# Patient Record
Sex: Female | Born: 1970 | Race: White | Hispanic: No | Marital: Married | Smoking: Never smoker
Health system: Southern US, Community
[De-identification: ages and names within clinical notes are randomized; demographics above are authoritative.]

## PROBLEM LIST (undated history)

## (undated) DIAGNOSIS — M47812 Spondylosis without myelopathy or radiculopathy, cervical region: Secondary | ICD-10-CM

## (undated) DIAGNOSIS — M3500 Sicca syndrome, unspecified: Secondary | ICD-10-CM

## (undated) DIAGNOSIS — M1990 Unspecified osteoarthritis, unspecified site: Secondary | ICD-10-CM

## (undated) HISTORY — PX: BUNIONECTOMY: SHX129

## (undated) HISTORY — DX: Unspecified osteoarthritis, unspecified site: M19.90

## (undated) HISTORY — DX: Sjogren syndrome, unspecified: M35.00

## (undated) HISTORY — DX: Spondylosis without myelopathy or radiculopathy, cervical region: M47.812

---

## 1998-04-01 HISTORY — PX: BUNIONECTOMY: SHX129

## 2003-03-11 ENCOUNTER — Inpatient Hospital Stay (HOSPITAL_COMMUNITY): Admission: AD | Admit: 2003-03-11 | Discharge: 2003-03-13 | Payer: Self-pay | Admitting: Obstetrics & Gynecology

## 2003-04-21 ENCOUNTER — Other Ambulatory Visit: Admission: RE | Admit: 2003-04-21 | Discharge: 2003-04-21 | Payer: Self-pay | Admitting: Obstetrics and Gynecology

## 2004-06-19 ENCOUNTER — Other Ambulatory Visit: Admission: RE | Admit: 2004-06-19 | Discharge: 2004-06-19 | Payer: Self-pay | Admitting: Obstetrics and Gynecology

## 2005-10-31 ENCOUNTER — Inpatient Hospital Stay (HOSPITAL_COMMUNITY): Admission: AD | Admit: 2005-10-31 | Discharge: 2005-10-31 | Payer: Self-pay | Admitting: Obstetrics and Gynecology

## 2005-12-17 ENCOUNTER — Inpatient Hospital Stay (HOSPITAL_COMMUNITY): Admission: AD | Admit: 2005-12-17 | Discharge: 2005-12-17 | Payer: Self-pay | Admitting: Obstetrics and Gynecology

## 2006-01-08 ENCOUNTER — Inpatient Hospital Stay (HOSPITAL_COMMUNITY): Admission: AD | Admit: 2006-01-08 | Discharge: 2006-01-08 | Payer: Self-pay | Admitting: Obstetrics and Gynecology

## 2006-01-28 ENCOUNTER — Inpatient Hospital Stay (HOSPITAL_COMMUNITY): Admission: RE | Admit: 2006-01-28 | Discharge: 2006-01-30 | Payer: Self-pay | Admitting: Obstetrics and Gynecology

## 2010-07-01 ENCOUNTER — Inpatient Hospital Stay (HOSPITAL_COMMUNITY): Admission: AD | Admit: 2010-07-01 | Payer: Self-pay | Admitting: Family Medicine

## 2010-09-14 ENCOUNTER — Inpatient Hospital Stay (HOSPITAL_COMMUNITY)
Admission: RE | Admit: 2010-09-14 | Discharge: 2010-09-16 | DRG: 373 | Disposition: A | Discharge status: Home / Self Care | Payer: BC Managed Care – PPO | Source: Ambulatory Visit | Attending: Obstetrics and Gynecology | Admitting: Obstetrics and Gynecology

## 2010-09-14 DIAGNOSIS — O09529 Supervision of elderly multigravida, unspecified trimester: Principal | ICD-10-CM | POA: Diagnosis present

## 2010-09-14 LAB — CBC
HCT: 38.1 % (ref 36.0–46.0)
Hemoglobin: 12.7 g/dL (ref 12.0–15.0)
RBC: 4.29 MIL/uL (ref 3.87–5.11)
RDW: 14.3 % (ref 11.5–15.5)
WBC: 7 10*3/uL (ref 4.0–10.5)

## 2010-09-15 LAB — CBC
MCV: 90 fL (ref 78.0–100.0)
Platelets: 129 10*3/uL — ABNORMAL LOW (ref 150–400)
RBC: 3.99 MIL/uL (ref 3.87–5.11)
WBC: 13.6 10*3/uL — ABNORMAL HIGH (ref 4.0–10.5)

## 2010-09-16 LAB — RH IMMUNE GLOB WKUP(>/=20WKS)(NOT WOMEN'S HOSP)

## 2015-02-21 ENCOUNTER — Other Ambulatory Visit: Payer: Self-pay | Admitting: Obstetrics and Gynecology

## 2015-02-21 DIAGNOSIS — R928 Other abnormal and inconclusive findings on diagnostic imaging of breast: Secondary | ICD-10-CM

## 2015-03-01 ENCOUNTER — Ambulatory Visit
Admission: RE | Admit: 2015-03-01 | Discharge: 2015-03-01 | Disposition: A | Discharge status: Home / Self Care | Payer: BLUE CROSS/BLUE SHIELD | Source: Ambulatory Visit | Attending: Obstetrics and Gynecology | Admitting: Obstetrics and Gynecology

## 2015-03-01 DIAGNOSIS — R928 Other abnormal and inconclusive findings on diagnostic imaging of breast: Secondary | ICD-10-CM

## 2016-03-14 ENCOUNTER — Other Ambulatory Visit: Payer: Self-pay | Admitting: Rheumatology

## 2016-03-14 DIAGNOSIS — M19072 Primary osteoarthritis, left ankle and foot: Secondary | ICD-10-CM

## 2016-03-14 DIAGNOSIS — M47816 Spondylosis without myelopathy or radiculopathy, lumbar region: Secondary | ICD-10-CM | POA: Insufficient documentation

## 2016-03-14 DIAGNOSIS — M19042 Primary osteoarthritis, left hand: Secondary | ICD-10-CM

## 2016-03-14 DIAGNOSIS — M3500 Sicca syndrome, unspecified: Secondary | ICD-10-CM | POA: Insufficient documentation

## 2016-03-14 DIAGNOSIS — M19071 Primary osteoarthritis, right ankle and foot: Secondary | ICD-10-CM | POA: Insufficient documentation

## 2016-03-14 DIAGNOSIS — M19041 Primary osteoarthritis, right hand: Secondary | ICD-10-CM | POA: Insufficient documentation

## 2016-03-14 DIAGNOSIS — M47819 Spondylosis without myelopathy or radiculopathy, site unspecified: Secondary | ICD-10-CM | POA: Insufficient documentation

## 2016-03-14 MED ORDER — PILOCARPINE HCL 5 MG PO TABS: ORAL_TABLET | ORAL | 2 refills | Status: DC

## 2016-03-14 MED ORDER — DICLOFENAC SODIUM 1 % TD GEL: TRANSDERMAL | 3 refills | Status: DC

## 2016-03-14 NOTE — Telephone Encounter (Signed)
Last visit: 03/20/15 Next Visit: 03/18/16  Okay to refill Pilocarpine and Voltaren Gel?

## 2016-03-14 NOTE — Telephone Encounter (Signed)
Patient is requesting refills of pilocarpine and voltaren gel be sent to [facility] in [place]. Patient has scheduled appointment for 03/18/16.

## 2016-03-14 NOTE — Progress Notes (Signed)
Office Visit Note  Patient: Hailey Duncan             Date of Birth: 1970-07-03           MRN: [id number]             PCP: Eloise Levels, NP Referring: No ref. provider found Visit Date: 03/18/2016 Occupation: @GUAROCC @    Subjective:  No chief complaint on file. History of sicca symptoms, OA of hands and feet, DDD of the C-spine.  History of Present Illness: Hailey Duncan is a 45 y.o. female  Last seen 03/20/2015 and was requested to come back in one year.  Since last visit, patient is doing relatively well. She's having good relief with pilocarpine. She uses half pill twice a day. She is having pain in the right elbow joint secondary to tendinitis. The brace that we gave her on the last visit is helping her. She uses it from time to time. She states that she has a flare depending on how she holds the steering well while driving, pushing a shopping cart, and other activities with similar use of the right elbow joint. I've given her Voltaren gel which she finds very useful.  She also complains of pain to the medial scapular border on the left side. Patient is consistent with musculoskeletal pain. No shortness of breath no chest discomfort no radiation of pain or diaphoresis. No falls no injuries.   Activities of Daily Living:  Patient reports morning stiffness for 15 minutes.   Patient Denies nocturnal pain.  Difficulty dressing/grooming: Denies Difficulty climbing stairs: Denies Difficulty getting out of chair: Denies Difficulty using hands for taps, buttons, cutlery, and/or writing: Denies   Review of Systems  Constitutional: Negative for fatigue.  HENT: Negative for mouth sores and mouth dryness.   Eyes: Negative for dryness.  Respiratory: Negative for shortness of breath.   Gastrointestinal: Negative for constipation and diarrhea.  Musculoskeletal: Negative for myalgias and myalgias.  Skin: Negative for sensitivity to sunlight.  Psychiatric/Behavioral:  Negative for decreased concentration and sleep disturbance.    PMFS History:  Patient Active Problem List   Diagnosis Date Noted  . Sicca syndrome, unspecified ([place]) 03/14/2016  . Primary osteoarthritis of both feet 03/14/2016  . Primary osteoarthritis of both hands 03/14/2016  . Spondylosis of lumbar region without myelopathy or radiculopathy 03/14/2016    Past Medical History:  Diagnosis Date  . Cervical arthritis ([place])   . Osteoarthritis   . Sicca syndrome, unspecified ([place])     Family History  Problem Relation Age of Onset  . Macular degeneration Mother   . Seizures Mother   . Hypertension Mother   . Hypertension Father   . Hypercholesterolemia Father   . Tremor Father    Past Surgical History:  Procedure Laterality Date  . BUNIONECTOMY     Social History   Social History Narrative  . No narrative on file     Objective: Vital Signs: BP 116/78 (BP Location: Left Arm, Patient Position: Sitting, Cuff Size: Large)   Pulse 79   Resp 14   Ht 5\' 5"  (1.651 m)   Wt 112 lb (50.8 kg)   BMI 18.64 kg/m    Physical Exam  Constitutional: She is oriented to person, place, and time. She appears well-developed and well-nourished.  HENT:  Head: Normocephalic and atraumatic.  Eyes: EOM are normal. Pupils are equal, round, and reactive to light.  Cardiovascular: Normal rate, regular rhythm and normal heart sounds.  Exam reveals no  gallop and no friction rub.   No murmur heard. Pulmonary/Chest: Effort normal and breath sounds normal. She has no wheezes. She has no rales.  Abdominal: Soft. Bowel sounds are normal. She exhibits no distension. There is no tenderness. There is no guarding. No hernia.  Musculoskeletal: Normal range of motion. She exhibits no edema, tenderness or deformity.  Lymphadenopathy:    She has no cervical adenopathy.  Neurological: She is alert and oriented to person, place, and time. Coordination normal.  Skin: Skin is warm and dry. Capillary refill takes  less than 2 seconds. No rash noted.  Psychiatric: She has a normal mood and affect. Her behavior is normal.     Musculoskeletal Exam:  Full range of motion of all joints Grip strength is equal and strong bilaterally Fibromyalgia tender points are all absent  CDAI Exam: No CDAI exam completed.  No synovitis on examination  Investigation: Findings:  Labs from 08/2012:  CBC with diff showed a white cell count of 3.6.  Comprehensive metabolic panel was normal.  C3 was 87 which was slightly below normal.  ANA was 1:240 nuclear speckled pattern.  Her ACE level, sed rate, C4, UA, CCP, and ENA were normal.  Vitamin D was 40.    Imaging: No results found.  Speciality Comments: No specialty comments available.    Procedures:  No procedures performed Allergies: Clindamycin/lincomycin and Penicillins   Assessment / Plan:     Visit Diagnoses: Spondylosis of lumbar region without myelopathy or radiculopathy  Sicca syndrome, unspecified ([facility])  High risk medications (not anticoagulants) long-term use - good results w/ pilocarpine during daytime use.;   Primary osteoarthritis of both hands  Primary osteoarthritis of both feet   Plan: 1: Patient will use Voltaren gel to the medial scapular border on the left side. We discussed the quantity and frequency. #2: Patient will continue Voltaren gel for the right elbow joint when necessary. She will also use the brace. #3: She will continue pilocarpine. She knows the quantity/frequency. #3: I offered her XYLIMELT  patches once again today.  She forgot about this product but we'll order it and try to use it because she especially has dry mouth at night. She confesses that she's been relying on sips of water through the night because of the "cot[place]mouth" that she suffers from at night. She has seen her dentist who is very happy with her dental care. Patient has been having benefit from using the pilocarpine as it relates the halitosis as well as  overall sicca symptom relief.  She does not require any refills at this time.  Return to clinic in 1 year for follow-up and sooner if there is any problems.   Sicca symptoms Orders: No orders of the defined types were placed in this encounter.  No orders of the defined types were placed in this encounter.   Face-to-face time spent with patient was 30 minutes. 50% of time was spent in counseling and coordination of care.  Follow-Up Instructions: Return in about 1 year (around 03/18/2017) for sicca sxs, pilocarpine, oa hands/feet, right elbow pain; left scapular pain.   Eliezer Lofts, PA-C

## 2016-03-18 ENCOUNTER — Encounter: Payer: Self-pay | Admitting: Rheumatology

## 2016-03-18 ENCOUNTER — Ambulatory Visit (INDEPENDENT_AMBULATORY_CARE_PROVIDER_SITE_OTHER): Payer: BLUE CROSS/BLUE SHIELD | Admitting: Rheumatology

## 2016-03-18 VITALS — BP 116/78 | HR 79 | Resp 14 | Ht 65.0 in | Wt 112.0 lb

## 2016-03-18 DIAGNOSIS — M19072 Primary osteoarthritis, left ankle and foot: Secondary | ICD-10-CM | POA: Diagnosis not present

## 2016-03-18 DIAGNOSIS — M3500 Sicca syndrome, unspecified: Secondary | ICD-10-CM | POA: Diagnosis not present

## 2016-03-18 DIAGNOSIS — M19041 Primary osteoarthritis, right hand: Secondary | ICD-10-CM | POA: Diagnosis not present

## 2016-03-18 DIAGNOSIS — M19071 Primary osteoarthritis, right ankle and foot: Secondary | ICD-10-CM | POA: Diagnosis not present

## 2016-03-18 DIAGNOSIS — Z79899 Other long term (current) drug therapy: Secondary | ICD-10-CM

## 2016-03-18 DIAGNOSIS — M47816 Spondylosis without myelopathy or radiculopathy, lumbar region: Secondary | ICD-10-CM

## 2016-03-18 DIAGNOSIS — M19042 Primary osteoarthritis, left hand: Secondary | ICD-10-CM | POA: Diagnosis not present

## 2016-04-28 IMAGING — MG MM DIAG BREAST TOMO UNI RIGHT
4 series · 4 of 12 positions shown · non-contrast
Comparison: 02/16/2015 and earlier

CLINICAL DATA: Patient returns after screening study for evaluation
of possible right breast asymmetry.

EXAM:
DIGITAL DIAGNOSTIC RIGHT MAMMOGRAM WITH 3D TOMOSYNTHESIS WITH CAD
ULTRASOUND RIGHT BREAST

[R CC]
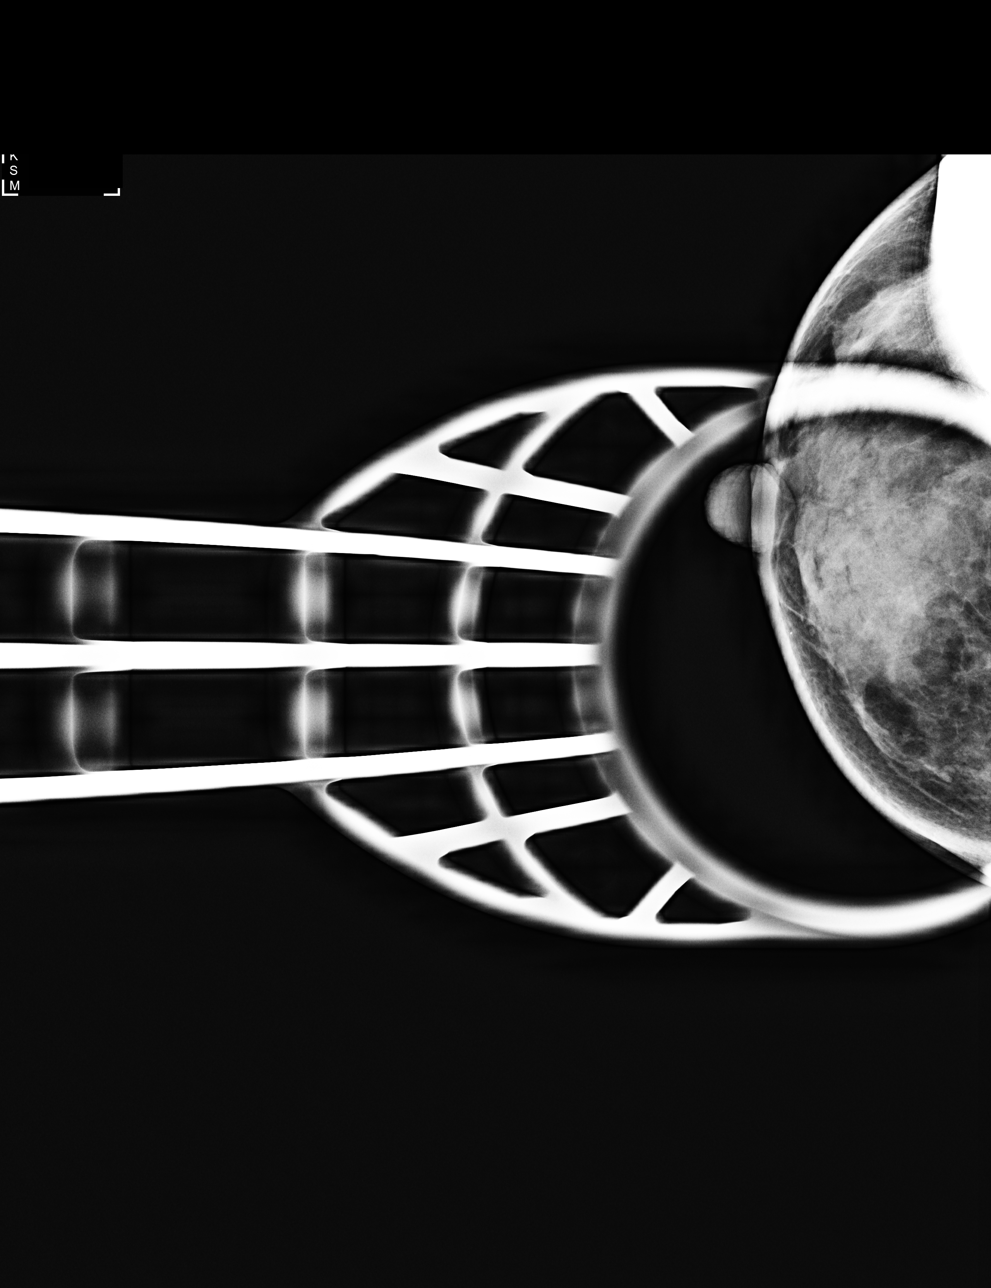

[R MLO]
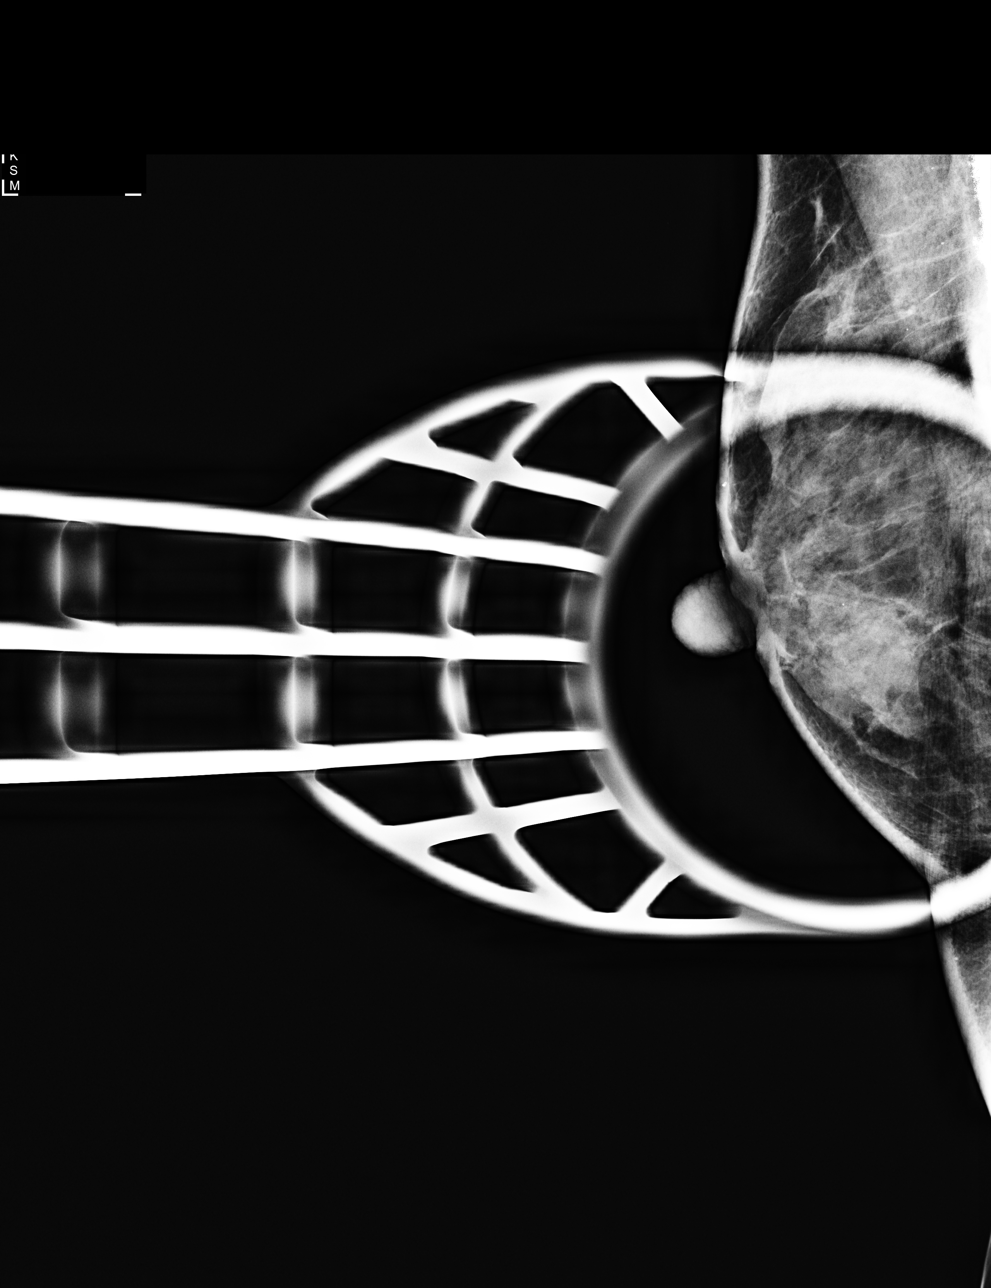

[R CC tomo · tomo slice 17/32.0]
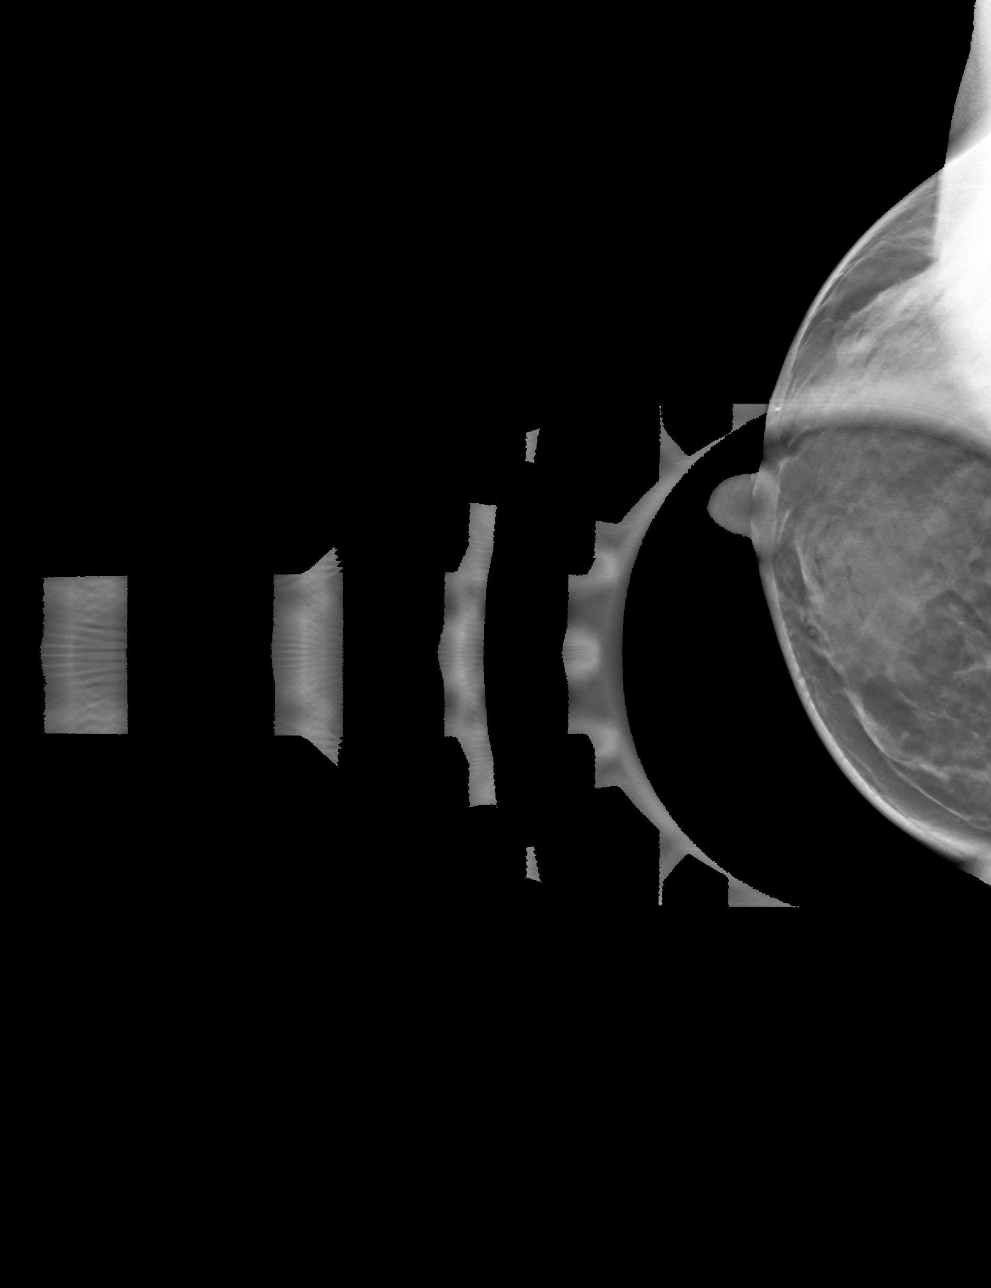

[R MLO tomo · tomo slice 19/37.0]
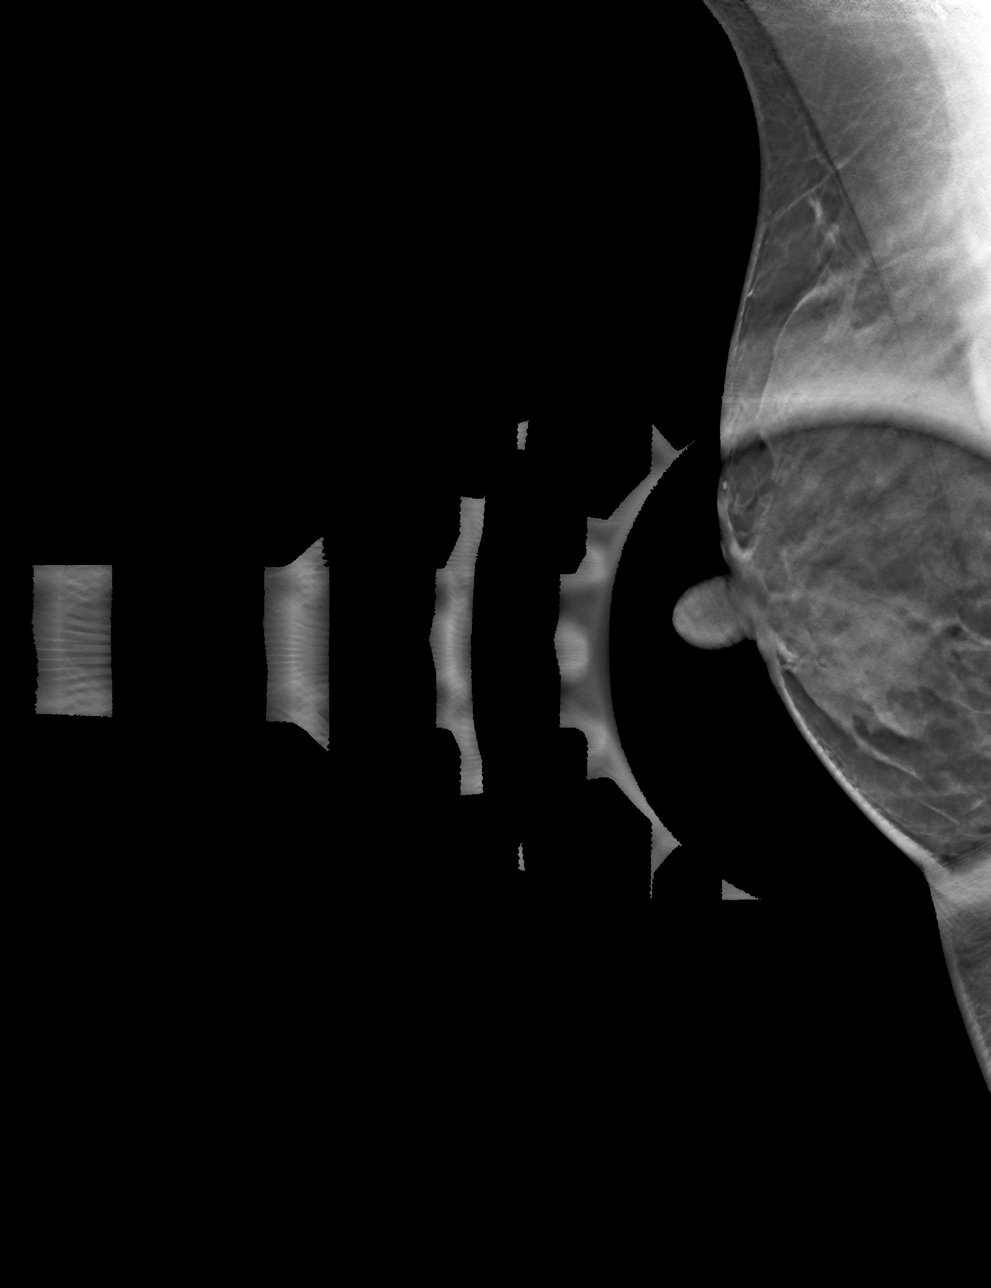

[4 of 12 positions shown; findings below may reference images not displayed]

ACR Breast Density Category d: The breast tissue is extremely dense,
which lowers the sensitivity of mammography.
FINDINGS: Additional views are performed, showing no persistent abnormality in
the lower inner quadrant of the right breast.

Mammographic images were processed with CAD.

On physical exam, I palpate no abnormality in the lower inner
quadrant of the right breast.

Targeted ultrasound is performed, showing normal appearing,
extremely dense tissue in the lower inner quadrant of the right
breast. No mass, distortion, or acoustic shadowing is demonstrated
with ultrasound.
IMPRESSION: No mammographic or ultrasound evidence for malignancy.

RECOMMENDATION:
Screening mammogram in one year.(Code:[id number])

I have discussed the findings and recommendations with the patient.
Results were also provided in writing at the conclusion of the
visit. If applicable, a reminder letter will be sent to the patient
regarding the next appointment.

BI-RADS CATEGORY  1: Negative.

## 2016-06-13 ENCOUNTER — Telehealth (INDEPENDENT_AMBULATORY_CARE_PROVIDER_SITE_OTHER): Payer: Self-pay | Admitting: Rheumatology

## 2016-06-13 NOTE — Telephone Encounter (Signed)
RECORDS FAXED TO [facility] [phone]

## 2016-06-17 ENCOUNTER — Other Ambulatory Visit: Payer: Self-pay | Admitting: Rheumatology

## 2016-06-17 NOTE — Telephone Encounter (Signed)
Last Visit: 03/18/16 Next Visit: 03/18/17  Okay to refill Pilocarpine?

## 2016-08-11 ENCOUNTER — Other Ambulatory Visit: Payer: Self-pay | Admitting: Rheumatology

## 2016-08-12 NOTE — Telephone Encounter (Signed)
ok 

## 2016-08-12 NOTE — Telephone Encounter (Signed)
Last Visit: 03/18/16 Next Visit: 03/18/17  Okay to refill Pilocarpine?

## 2016-09-27 ENCOUNTER — Other Ambulatory Visit: Payer: Self-pay | Admitting: Rheumatology

## 2016-09-27 NOTE — Telephone Encounter (Signed)
Last Visit: 03/18/16 Next Visit: 03/18/17  Okay to refill Pilocarpine per Dr. Estanislado Pandy

## 2016-10-21 ENCOUNTER — Other Ambulatory Visit: Payer: Self-pay | Admitting: Rheumatology

## 2016-10-21 NOTE — Telephone Encounter (Signed)
Last Visit: 03/18/16 Next Visit: 03/18/17  Okay to refill Pilocarpine per Dr. Estanislado Pandy

## 2016-11-18 ENCOUNTER — Other Ambulatory Visit: Payer: Self-pay | Admitting: Rheumatology

## 2016-11-18 NOTE — Telephone Encounter (Signed)
09/29/16 last visit  03/18/17 next visit  Ok to refill per Dr Deveshwar  

## 2016-12-20 ENCOUNTER — Other Ambulatory Visit: Payer: Self-pay | Admitting: Rheumatology

## 2016-12-20 NOTE — Telephone Encounter (Signed)
09/29/16 last visit  03/18/17 next visit  Ok to refill per Dr Estanislado Pandy

## 2017-01-22 ENCOUNTER — Other Ambulatory Visit: Payer: Self-pay | Admitting: Rheumatology

## 2017-01-22 NOTE — Telephone Encounter (Signed)
Last Visit: 03/18/16 Next Visit: 03/18/17  Okay to refill per Dr. Estanislado Pandy

## 2017-03-04 NOTE — Progress Notes (Signed)
Office Visit Note  Patient: Hailey Duncan             Date of Birth: 06/07/1970           MRN: [id number]             PCP: Patient, No Pcp Per Referring: Eloise Levels, NP Visit Date: 03/18/2017 Occupation: @GUAROCC @    Subjective:  Bilateral knee pain    History of Present Illness: Hailey Duncan is a 46 y.o. female with a history of sicca syndrome, positive ANA, and osteoarthritis.  Patient states she has been having increased bilateral knee pain, worse in her left knee.  She states her left knee will occasionally swell and "give out" on her.  She states she hears popping when going up and down stairs and experiences joint stiffness. She uses a pillow to prop up her knee at night.  Patient states her mouth dryness and eye dryness have improved significantly with the Pilocarpine.  She states she continues to drink water frequently and takes half a tablet of Pilocarpine in the morning and half at night.  She also continues to have right elbow tendonitis.  She wears her brace regularly and uses Voltaren gel, which helps.  She states she continues to have neck pain due to her disc disease and muscle tightness. She denies any muscle spasms of her back muscles.  She does neck exercises regularly.  She takes Tumeric 1000 mg in the morning and 500 mg in the evening.  She states her neck and elbow pain occasionally wake her up at night.     Activities of Daily Living:  Patient reports morning stiffness for 15  minutes.   Patient Reports nocturnal pain.  Difficulty dressing/grooming: Denies Difficulty climbing stairs: Denies Difficulty getting out of chair: Denies Difficulty using hands for taps, buttons, cutlery, and/or writing: Denies   Review of Systems  Constitutional: Positive for fatigue. Negative for weakness.  HENT: Negative for mouth sores, mouth dryness and nose dryness.   Eyes: Negative for redness and dryness.  Respiratory: Negative for cough, shortness of breath and  difficulty breathing.   Cardiovascular: Negative for chest pain, palpitations, hypertension and swelling in legs/feet.  Gastrointestinal: Negative for blood in stool, constipation and diarrhea.  Endocrine: Negative for increased urination.  Genitourinary: Negative for painful urination.  Musculoskeletal: Positive for arthralgias, joint pain and morning stiffness. Negative for joint swelling, myalgias, muscle weakness, muscle tenderness and myalgias.  Skin: Negative for color change, pallor, rash, hair loss, nodules/bumps, redness, skin tightness, ulcers and sensitivity to sunlight.  Allergic/Immunologic: Negative for susceptible to infections.  Neurological: Negative for dizziness, numbness and headaches.  Hematological: Negative for swollen glands.  Psychiatric/Behavioral: Positive for sleep disturbance. Negative for depressed mood. The patient is not nervous/anxious.     PMFS History:  Patient Active Problem List   Diagnosis Date Noted  . Sicca syndrome, unspecified ([place]) 03/14/2016  . Primary osteoarthritis of both feet 03/14/2016  . Primary osteoarthritis of both hands 03/14/2016  . Spondylosis of lumbar region without myelopathy or radiculopathy 03/14/2016    Past Medical History:  Diagnosis Date  . Cervical arthritis   . Osteoarthritis   . Sicca syndrome, unspecified ([place])     Family History  Problem Relation Age of Onset  . Macular degeneration Mother   . Seizures Mother        under control   . Hypertension Mother   . Hypertension Father   . Hypercholesterolemia Father   . Tremor Father   .  Hypercholesterolemia Sister    Past Surgical History:  Procedure Laterality Date  . BUNIONECTOMY     Social History   Social History Narrative  . Not on file     Objective: Vital Signs: BP 122/79 (BP Location: Left Arm, Patient Position: Sitting, Cuff Size: Normal)   Pulse 86   Resp 16   Ht 5\' 5"  (1.651 m)   Wt 111 lb (50.3 kg)   BMI 18.47 kg/m    Physical Exam    Constitutional: She is oriented to person, place, and time. She appears well-developed and well-nourished.  HENT:  Head: Normocephalic and atraumatic.  Eyes: Conjunctivae and EOM are normal.  Neck: Normal range of motion.  Cardiovascular: Normal rate, regular rhythm, normal heart sounds and intact distal pulses.  Pulmonary/Chest: Effort normal and breath sounds normal.  Abdominal: Soft. Bowel sounds are normal.  Lymphadenopathy:    She has no cervical adenopathy.  Neurological: She is alert and oriented to person, place, and time.  Skin: Skin is warm and dry. Capillary refill takes less than 2 seconds.  Psychiatric: She has a normal mood and affect. Her behavior is normal.  Nursing note and vitals reviewed.    Musculoskeletal Exam: C-spine, thoracic, and lumbar spine good ROM. Shoulder joints, elbow joints, and wrist joints good ROM.  MCPs, PIPs, and DIPs good ROM with no synovitis.  Hip joints, knee joint, and ankle joints good ROM.  MTPs,  PIP, and DIPs good ROM with no synovitis.  No trochanteric bursitis.    CDAI Exam: No CDAI exam completed.    Investigation: No additional findings.   Imaging: Xr Knee 3 View Left  Result Date: 03/18/2017 Mild medial compartment narrowing.  No chondrocalcinosis.  No osteophytes or erosions noted. Mild patellofemoral joint space narrowing. Impression: Mild osteoarthritis and mild chondromalacia patella.   Xr Knee 3 View Right  Result Date: 03/18/2017 Mild medial compartment narrowing.  No chondrocalcinosis.  No osteophytes or erosions noted. Impression: Mild osteoarthritis and mild patellofemoral joint space narrowing.    Speciality Comments: No specialty comments available.    Procedures:  No procedures performed Allergies: Clindamycin/lincomycin and Penicillins   Assessment / Plan:     Visit Diagnoses: Sicca syndrome, unspecified ([place]): She continues to take Pilocarpine 5 mg, 1/2 tablet in the morning and 1/2 tablet in the  evening, which has helped her dry mouth and dry eye significantly.  She drinks water frequently as well.  She will continue on Pilocarpine.    Lateral epicondylitis, right elbow: patient continues to have tenderness over right lateral epicondyle.  She continues to use her brace regularly and uses Voltaren gel.  Patient was given a handout for exercises.  ANA positive  Primary osteoarthritis of both hands: Discussed joint protection and muscle strengthening.  She uses Voltaren gel and takes tumeric daily.    Primary osteoarthritis of both feet: Discussed proper fitting shoes and use of metatarsal pads to help with her discomfort.  No synovitis on exam.    Spondylosis of lumbar region without myelopathy or radiculopathy: Chronic, stable.   DDD (degenerative disc disease), cervical: Chronic pain.  Good ROM.  She performs neck exercises at home.  She previously went to physical therapy, which helped a lot.  She uses Voltaren gel for pain relief.  Handout for neck exercises was provided.   History of TMJ disorder  History of bunionectomy: Stiffness, no discomfort at this time.  Discussed the importance of proper fitting shoes.   Left knee pain, unspecified chronicity - Good  ROM.  No effusion or warmth on exam.  Discussed cortisone injection of her knee in the future if her symptoms progress. X-rays reveal mild osteoarthritis and mild chondromalacia patella. Handout of knee exercises was provided. Plan: XR KNEE 3 VIEW LEFT  Right knee pain, unspecified chronicity -Good ROM with no effusion or warmth on exam.  Discussed cortisone injection of her knee in the future if symptoms progress. Hailey Duncan reveals mild osteoarthritis. Plan: XR KNEE 3 VIEW RIGHT    Orders: Orders Placed This Encounter  Procedures  . XR KNEE 3 VIEW LEFT  . XR KNEE 3 VIEW RIGHT   No orders of the defined types were placed in this encounter.   Face-to-face time spent with patient was 30 minutes. Greater than 50% of time was  spent in counseling and coordination of care.  Follow-Up Instructions: Return in about 1 year (around 03/18/2018) for Sicca Syndrome and osteoarthritis .  Note - This record has been created using [employer].  Chart creation errors have been sought, but may not always  have been located. Such creation errors do not reflect on  the standard of medical care.

## 2017-03-18 ENCOUNTER — Encounter: Payer: Self-pay | Admitting: Rheumatology

## 2017-03-18 ENCOUNTER — Ambulatory Visit (INDEPENDENT_AMBULATORY_CARE_PROVIDER_SITE_OTHER): Payer: Self-pay

## 2017-03-18 ENCOUNTER — Ambulatory Visit: Payer: BLUE CROSS/BLUE SHIELD | Admitting: Rheumatology

## 2017-03-18 VITALS — BP 122/79 | HR 86 | Resp 16 | Ht 65.0 in | Wt 111.0 lb

## 2017-03-18 DIAGNOSIS — M3500 Sicca syndrome, unspecified: Secondary | ICD-10-CM

## 2017-03-18 DIAGNOSIS — Z8739 Personal history of other diseases of the musculoskeletal system and connective tissue: Secondary | ICD-10-CM | POA: Diagnosis not present

## 2017-03-18 DIAGNOSIS — M19042 Primary osteoarthritis, left hand: Secondary | ICD-10-CM

## 2017-03-18 DIAGNOSIS — M5030 Other cervical disc degeneration, unspecified cervical region: Secondary | ICD-10-CM | POA: Diagnosis not present

## 2017-03-18 DIAGNOSIS — R768 Other specified abnormal immunological findings in serum: Secondary | ICD-10-CM

## 2017-03-18 DIAGNOSIS — M25562 Pain in left knee: Secondary | ICD-10-CM | POA: Diagnosis not present

## 2017-03-18 DIAGNOSIS — M19071 Primary osteoarthritis, right ankle and foot: Secondary | ICD-10-CM | POA: Diagnosis not present

## 2017-03-18 DIAGNOSIS — M19041 Primary osteoarthritis, right hand: Secondary | ICD-10-CM | POA: Diagnosis not present

## 2017-03-18 DIAGNOSIS — M47816 Spondylosis without myelopathy or radiculopathy, lumbar region: Secondary | ICD-10-CM

## 2017-03-18 DIAGNOSIS — M25561 Pain in right knee: Secondary | ICD-10-CM | POA: Diagnosis not present

## 2017-03-18 DIAGNOSIS — Z9889 Other specified postprocedural states: Secondary | ICD-10-CM | POA: Diagnosis not present

## 2017-03-18 DIAGNOSIS — M7711 Lateral epicondylitis, right elbow: Secondary | ICD-10-CM

## 2017-03-18 DIAGNOSIS — M19072 Primary osteoarthritis, left ankle and foot: Secondary | ICD-10-CM

## 2017-03-18 NOTE — Patient Instructions (Signed)
Knee Exercises Ask your health care provider which exercises are safe for you. Do exercises exactly as told by your health care provider and adjust them as directed. It is normal to feel mild stretching, pulling, tightness, or discomfort as you do these exercises, but you should stop right away if you feel sudden pain or your pain gets worse.Do not begin these exercises until told by your health care provider. STRETCHING AND RANGE OF MOTION EXERCISES These exercises warm up your muscles and joints and improve the movement and flexibility of your knee. These exercises also help to relieve pain, numbness, and tingling. Exercise A: Knee Extension, Prone 1. Lie on your abdomen on a bed. 2. Place your left / right knee just beyond the edge of the surface so your knee is not on the bed. You can put a towel under your left / right thigh just above your knee for comfort. 3. Relax your leg muscles and allow gravity to straighten your knee. You should feel a stretch behind your left / right knee. 4. Hold this position for __________ seconds. 5. Scoot up so your knee is supported between repetitions. Repeat __________ times. Complete this stretch __________ times a day. Exercise B: Knee Flexion, Active  1. Lie on your back with both knees straight. If this causes back discomfort, bend your left / right knee so your foot is flat on the floor. 2. Slowly slide your left / right heel back toward your buttocks until you feel a gentle stretch in the front of your knee or thigh. 3. Hold this position for __________ seconds. 4. Slowly slide your left / right heel back to the starting position. Repeat __________ times. Complete this exercise __________ times a day. Exercise C: Quadriceps, Prone  1. Lie on your abdomen on a firm surface, such as a bed or padded floor. 2. Bend your left / right knee and hold your ankle. If you cannot reach your ankle or pant leg, loop a belt around your foot and grab the belt  instead. 3. Gently pull your heel toward your buttocks. Your knee should not slide out to the side. You should feel a stretch in the front of your thigh and knee. 4. Hold this position for __________ seconds. Repeat __________ times. Complete this stretch __________ times a day. Exercise D: Hamstring, Supine 1. Lie on your back. 2. Loop a belt or towel over the ball of your left / right foot. The ball of your foot is on the walking surface, right under your toes. 3. Straighten your left / right knee and slowly pull on the belt to raise your leg until you feel a gentle stretch behind your knee. ? Do not let your left / right knee bend while you do this. ? Keep your other leg flat on the floor. 4. Hold this position for __________ seconds. Repeat __________ times. Complete this stretch __________ times a day. STRENGTHENING EXERCISES These exercises build strength and endurance in your knee. Endurance is the ability to use your muscles for a long time, even after they get tired. Exercise E: Quadriceps, Isometric  1. Lie on your back with your left / right leg extended and your other knee bent. Put a rolled towel or small pillow under your knee if told by your health care provider. 2. Slowly tense the muscles in the front of your left / right thigh. You should see your kneecap slide up toward your hip or see increased dimpling just above the knee. This   motion will push the back of the knee toward the floor. 3. For __________ seconds, keep the muscle as tight as you can without increasing your pain. 4. Relax the muscles slowly and completely. Repeat __________ times. Complete this exercise __________ times a day. Exercise F: Straight Leg Raises - Quadriceps 1. Lie on your back with your left / right leg extended and your other knee bent. 2. Tense the muscles in the front of your left / right thigh. You should see your kneecap slide up or see increased dimpling just above the knee. Your thigh may  even shake a bit. 3. Keep these muscles tight as you raise your leg 4-6 inches (10-15 cm) off the floor. Do not let your knee bend. 4. Hold this position for __________ seconds. 5. Keep these muscles tense as you lower your leg. 6. Relax your muscles slowly and completely after each repetition. Repeat __________ times. Complete this exercise __________ times a day. Exercise G: Hamstring, Isometric 1. Lie on your back on a firm surface. 2. Bend your left / right knee approximately __________ degrees. 3. Dig your left / right heel into the surface as if you are trying to pull it toward your buttocks. Tighten the muscles in the back of your thighs to dig as hard as you can without increasing any pain. 4. Hold this position for __________ seconds. 5. Release the tension gradually and allow your muscles to relax completely for __________ seconds after each repetition. Repeat __________ times. Complete this exercise __________ times a day. Exercise H: Hamstring Curls  If told by your health care provider, do this exercise while wearing ankle weights. Begin with __________ weights. Then increase the weight by 1 lb (0.5 kg) increments. Do not wear ankle weights that are more than __________. 1. Lie on your abdomen with your legs straight. 2. Bend your left / right knee as far as you can without feeling pain. Keep your hips flat against the floor. 3. Hold this position for __________ seconds. 4. Slowly lower your leg to the starting position.  Repeat __________ times. Complete this exercise __________ times a day. Exercise I: Squats (Quadriceps) 1. Stand in front of a table, with your feet and knees pointing straight ahead. You may rest your hands on the table for balance but not for support. 2. Slowly bend your knees and lower your hips like you are going to sit in a chair. ? Keep your weight over your heels, not over your toes. ? Keep your lower legs upright so they are parallel with the table  legs. ? Do not let your hips go lower than your knees. ? Do not bend lower than told by your health care provider. ? If your knee pain increases, do not bend as low. 3. Hold the squat position for __________ seconds. 4. Slowly push with your legs to return to standing. Do not use your hands to pull yourself to standing. Repeat __________ times. Complete this exercise __________ times a day. Exercise J: Wall Slides (Quadriceps)  1. Lean your back against a smooth wall or door while you walk your feet out 18-24 inches (46-61 cm) from it. 2. Place your feet hip-width apart. 3. Slowly slide down the wall or door until your knees bend __________ degrees. Keep your knees over your heels, not over your toes. Keep your knees in line with your hips. 4. Hold for __________ seconds. Repeat __________ times. Complete this exercise __________ times a day. Exercise K: Straight Leg Raises -   Hip Abductors 1. Lie on your side with your left / right leg in the top position. Lie so your head, shoulder, knee, and hip line up. You may bend your bottom knee to help you keep your balance. 2. Roll your hips slightly forward so your hips are stacked directly over each other and your left / right knee is facing forward. 3. Leading with your heel, lift your top leg 4-6 inches (10-15 cm). You should feel the muscles in your outer hip lifting. ? Do not let your foot drift forward. ? Do not let your knee roll toward the ceiling. 4. Hold this position for __________ seconds. 5. Slowly return your leg to the starting position. 6. Let your muscles relax completely after each repetition. Repeat __________ times. Complete this exercise __________ times a day. Exercise L: Straight Leg Raises - Hip Extensors 1. Lie on your abdomen on a firm surface. You can put a pillow under your hips if that is more comfortable. 2. Tense the muscles in your buttocks and lift your left / right leg about 4-6 inches (10-15 cm). Keep your knee  straight as you lift your leg. 3. Hold this position for __________ seconds. 4. Slowly lower your leg to the starting position. 5. Let your leg relax completely after each repetition. Repeat __________ times. Complete this exercise __________ times a day. This information is not intended to replace advice given to you by your health care provider. Make sure you discuss any questions you have with your health care provider. Document Released: 01/30/2005 Document Revised: 12/11/2015 Document Reviewed: 01/22/2015 Elsevier Interactive Patient Education  2018 [place]. Neck Exercises Neck exercises can be important for many reasons:  They can help you to improve and maintain flexibility in your neck. This can be especially important as you age.  They can help to make your neck stronger. This can make movement easier.  They can reduce or prevent neck pain.  They may help your upper back.  Ask your health care provider which neck exercises would be best for you. Exercises Neck Press Repeat this exercise 10 times. Do it first thing in the morning and right before bed or as told by your health care provider. 1. Lie on your back on a firm bed or on the floor with a pillow under your head. 2. Use your neck muscles to push your head down on the pillow and straighten your spine. 3. Hold the position as well as you can. Keep your head facing up and your chin tucked. 4. Slowly count to 5 while holding this position. 5. Relax for a few seconds. Then repeat.  Isometric Strengthening Do a full set of these exercises 2 times a day or as told by your health care provider. 1. Sit in a supportive chair and place your hand on your forehead. 2. Push forward with your head and neck while pushing back with your hand. Hold for 10 seconds. 3. Relax. Then repeat the exercise 3 times. 4. Next, do thesequence again, this time putting your hand against the back of your head. Use your head and neck to push  backward against the hand pressure. 5. Finally, do the same exercise on either side of your head, pushing sideways against the pressure of your hand.  Prone Head Lifts Repeat this exercise 5 times. Do this 2 times a day or as told by your health care provider. 1. Lie face-down, resting on your elbows so that your chest and upper back are raised.  2. Start with your head facing downward, near your chest. Position your chin either on or near your chest. 3. Slowly lift your head upward. Lift until you are looking straight ahead. Then continue lifting your head as far back as you can stretch. 4. Hold your head up for 5 seconds. Then slowly lower it to your starting position.  Supine Head Lifts Repeat this exercise 8-10 times. Do this 2 times a day or as told by your health care provider. 1. Lie on your back, bending your knees to point to the ceiling and keeping your feet flat on the floor. 2. Lift your head slowly off the floor, raising your chin toward your chest. 3. Hold for 5 seconds. 4. Relax and repeat.  Scapular Retraction Repeat this exercise 5 times. Do this 2 times a day or as told by your health care provider. 1. Stand with your arms at your sides. Look straight ahead. 2. Slowly pull both shoulders backward and downward until you feel a stretch between your shoulder blades in your upper back. 3. Hold for 10-30 seconds. 4. Relax and repeat.  Contact a health care provider if:  Your neck pain or discomfort gets much worse when you do an exercise.  Your neck pain or discomfort does not improve within 2 hours after you exercise. If you have any of these problems, stop exercising right away. Do not do the exercises again unless your health care provider says that you can. Get help right away if:  You develop sudden, severe neck pain. If this happens, stop exercising right away. Do not do the exercises again unless your health care provider says that you can. Exercises Neck  Stretch  Repeat this exercise 3-5 times. 1. Do this exercise while standing or while sitting in a chair. 2. Place your feet flat on the floor, shoulder-width apart. 3. Slowly turn your head to the right. Turn it all the way to the right so you can look over your right shoulder. Do not tilt or tip your head. 4. Hold this position for 10-30 seconds. 5. Slowly turn your head to the left, to look over your left shoulder. 6. Hold this position for 10-30 seconds.  Neck Retraction Repeat this exercise 8-10 times. Do this 3-4 times a day or as told by your health care provider. 1. Do this exercise while standing or while sitting in a sturdy chair. 2. Look straight ahead. Do not bend your neck. 3. Use your fingers to push your chin backward. Do not bend your neck for this movement. Continue to face straight ahead. If you are doing the exercise properly, you will feel a slight sensation in your throat and a stretch at the back of your neck. 4. Hold the stretch for 1-2 seconds. Relax and repeat.  This information is not intended to replace advice given to you by your health care provider. Make sure you discuss any questions you have with your health care provider. Document Released: 02/27/2015 Document Revised: 08/24/2015 Document Reviewed: 09/26/2014 Elsevier Interactive Patient Education  2017 [place] Ask your health care provider which exercises are safe for you. Do exercises exactly as told by your health care provider and adjust them as directed. It is normal to feel mild stretching, pulling, tightness, or discomfort as you do these exercises, but you should stop right away if you feel sudden pain or your pain gets worse. Do not begin these exercises until told by your health care provider. Stretching and  range of motion exercises These exercises warm up your muscles and joints and improve the movement and flexibility of your elbow. These exercises also help to relieve  pain, numbness, and tingling. Exercise A: Wrist extensor stretch 1. Extend your left / right elbow with your fingers pointing down. 2. Gently pull the palm of your left / right hand toward you until you feel a gentle stretch on the top of your forearm. 3. To increase the stretch, push your left / right hand toward the outer edge or pinkie side of your forearm. 4. Hold this position for __________ seconds. Repeat __________ times. Complete this exercise __________ times a day. If directed by your health care provider, repeat this stretch except do it with a bent elbow this time. Exercise B: Wrist flexor stretch  1. Extend your left / right elbow and turn your palm upward. 2. Gently pull your left / right palm and fingertips back so your wrist extends and your fingers point more toward the ground. 3. You should feel a gentle stretch on the inside of your forearm. 4. Hold this position for __________ seconds. Repeat __________ times. Complete this exercise __________ times a day. If directed by your health care provider, repeat this stretch except do it with a bent elbow this time. Strengthening exercises These exercises build strength and endurance in your elbow. Endurance is the ability to use your muscles for a long time, even after they get tired. Exercise C: Wrist extensors  1. Sit with your left / right forearm palm-down and fully supported on a table or countertop. Your elbow should be resting below the height of your shoulder. 2. Let your left / right wrist extend over the edge of the surface. 3. Loosely hold a __________ weight or a piece of rubber exercise band or tubing in your left / right hand. Slowly curl your left / right hand up toward your forearm. If you are using band or tubing, hold the band or tubing in place with your other hand to provide resistance. 4. Hold this position for __________ seconds. 5. Slowly return to the starting position. Repeat __________ times. Complete  this exercise __________ times a day. Exercise D: Radial deviators  1. Stand with a __________ weight in your left / righthand. Or, sit while holding a rubber exercise band or tubing with your other arm supported on a table or countertop. Position your hand so your thumb is on top. 2. Raise your hand upward in front of you so your thumb travels toward your forearm, or pull up on the rubber tubing. 3. Hold this position for __________ seconds. 4. Slowly return to the starting position. Repeat __________ times. Complete this exercise __________ times a day. Exercise E: Eccentric wrist extensors 1. Sit with your left / right forearm palm-down and fully supported on a table or countertop. Your elbow should be resting below the height of your shoulder. 2. If told by your health care provider, hold a __________ weight in your hand. 3. Let your left / right wrist extend over the edge of the surface. 4. Use your other hand to lift up your left / right hand toward your forearm. Keep your forearm on the table. 5. Using only the muscles in your left / right hand, slowly lower your hand back down to the starting position. Repeat __________ times. Complete this exercise __________ times a day. This information is not intended to replace advice given to you by your health care provider. Make sure you  discuss any questions you have with your health care provider. Document Released: 03/18/2005 Document Revised: 11/22/2015 Document Reviewed: 12/15/2014 Elsevier Interactive Patient Education  [employer].

## 2017-07-22 NOTE — Progress Notes (Signed)
Office Visit Note  Patient: Hailey Duncan             Date of Birth: Nov 01, 1970           MRN: [id number]             PCP: Molli Posey, MD Referring: No ref. provider found Visit Date: 07/24/2017 Occupation: @GUAROCC @    Subjective:  Left elbow pain   History of Present Illness: Hailey Duncan is a 47 y.o. female with history of sicca syndrome and osteoarthritis.  Patient states that since her last visit she continues to have significant discomfort of her left elbow.  She states that she has been using NSAIDs, Voltaren gel, and a tennis elbow brace without relief.  She states that she has been unable to do her daily activities.  She states that time she feels a clicking or popping sensation in her left elbow and  is also starting to have discomfort in her left shoulder due to making positional accommodations for her elbow.  She reports that she is unable to lift anything more than a couple pounds without significant discomfort.  The pain is been waking her up at night.  She states that she has not had a full night sleep in a couple months.   Activities of Daily Living:  Patient reports morning stiffness for 20 minutes.   Patient Reports nocturnal pain.  Difficulty dressing/grooming: Denies Difficulty climbing stairs: Denies Difficulty getting out of chair: Denies Difficulty using hands for taps, buttons, cutlery, and/or writing: Reports   Review of Systems  Constitutional: Negative for fatigue.  HENT: Positive for mouth dryness (takes Pilocarpine ). Negative for mouth sores and nose dryness.   Eyes: Positive for dryness. Negative for pain and visual disturbance.  Respiratory: Negative for cough, hemoptysis, shortness of breath and difficulty breathing.   Cardiovascular: Negative for chest pain, palpitations, hypertension and swelling in legs/feet.  Gastrointestinal: Negative for blood in stool, constipation and diarrhea.  Endocrine: Negative for increased urination.    Genitourinary: Negative for painful urination.  Musculoskeletal: Positive for arthralgias, joint pain and morning stiffness. Negative for joint swelling, myalgias, muscle weakness, muscle tenderness and myalgias.  Skin: Negative for color change, pallor, rash, hair loss, nodules/bumps, skin tightness, ulcers and sensitivity to sunlight.  Allergic/Immunologic: Negative for susceptible to infections.  Neurological: Negative for dizziness, numbness, headaches and weakness.  Hematological: Negative for swollen glands.  Psychiatric/Behavioral: Negative for depressed mood and sleep disturbance. The patient is nervous/anxious.     PMFS History:  Patient Active Problem List   Diagnosis Date Noted  . Sicca syndrome, unspecified ([place]) 03/14/2016  . Primary osteoarthritis of both feet 03/14/2016  . Primary osteoarthritis of both hands 03/14/2016  . Spondylosis of lumbar region without myelopathy or radiculopathy 03/14/2016    Past Medical History:  Diagnosis Date  . Cervical arthritis   . Osteoarthritis   . Sicca syndrome, unspecified ([place])     Family History  Problem Relation Age of Onset  . Macular degeneration Mother   . Seizures Mother        under control   . Hypertension Mother   . Hypertension Father   . Hypercholesterolemia Father   . Tremor Father   . Hypercholesterolemia Sister    Past Surgical History:  Procedure Laterality Date  . BUNIONECTOMY     Social History   Social History Narrative  . Not on file     Objective: Vital Signs: BP 128/67 (BP Location: Right Arm, Patient Position:  Sitting, Cuff Size: Normal)   Pulse 79   Resp 14   Ht 5\' 5"  (1.651 m)   Wt 116 lb (52.6 kg)   BMI 19.30 kg/m    Physical Exam  Constitutional: She is oriented to person, place, and time. She appears well-developed and well-nourished.  HENT:  Head: Normocephalic and atraumatic.  Eyes: Conjunctivae and EOM are normal.  Neck: Normal range of motion.  Cardiovascular: Normal rate,  regular rhythm, normal heart sounds and intact distal pulses.  Pulmonary/Chest: Effort normal and breath sounds normal.  Abdominal: Soft. Bowel sounds are normal.  Lymphadenopathy:    She has no cervical adenopathy.  Neurological: She is alert and oriented to person, place, and time.  Skin: Skin is warm and dry. Capillary refill takes less than 2 seconds.  Psychiatric: She has a normal mood and affect. Her behavior is normal.  Nursing note and vitals reviewed.    Musculoskeletal Exam: C-spine, thoracic spine, lumbar spine good ROM.  No midline spinal tenderness.  No SI joint tenderness. Crepitus of left shoulder with ROM.  Discomfort with ROM of left shoulder.  Good ROM of right shoulder. Tenderness of left lateral epicondyle. Right elbow good ROM with no discomfort.  Wrist joints, MCPs, PIPs, and DIPs good ROM with no synovitis.  PIP and DIP synovial thickening consistent with osteoarthritis.  Hip joints, knee joints, ankle joints, MTPs, PIPs, and DIPs good ROM with no synovitis.    CDAI Exam: No CDAI exam completed.    Investigation: No additional findings. CBC Latest Ref Rng & Units 09/15/2010 09/14/2010  WBC 4.0 - 10.5 K/uL 13.6(H) 7.0  Hemoglobin 12.0 - 15.0 g/dL 11.8(L) 12.7  Hematocrit 36.0 - 46.0 % 35.9(L) 38.1  Platelets 150 - 400 K/uL 129(L) 141(L)  No flowsheet data found.   Imaging: Xr Elbow 2 Views Left  Result Date: 07/24/2017 No joint space narrowing or erosive changes were noted. Impression: Unremarkable x-ray of the elbow joint.   Speciality Comments: No specialty comments available.    Procedures:  Medium Joint Inj: L lateral epicondyle on 07/24/2017 2:47 PM Indications: pain Details: 27 G 1.5 in needle, lateral approach Medications: 1 mL lidocaine 1 %; 30 mg triamcinolone acetonide 40 MG/ML Aspirate: 0 mL Outcome: tolerated well, no immediate complications Procedure, treatment alternatives, risks and benefits explained, specific risks discussed. Consent was  given by the patient. Immediately prior to procedure a time out was called to verify the correct patient, procedure, equipment, support staff and site/side marked as required. Patient was prepped and draped in the usual sterile fashion.     Allergies: Clindamycin/lincomycin and Penicillins   Assessment / Plan:     Visit Diagnoses: Sicca syndrome, unspecified ([place]): She continues to have sicca symptoms.  She takes pilocarpine on a daily basis.  She is taking 7.5 mg/day.  Lateral epicondylitis, left elbow - She continues to have tenderness of the left lateral epicondyle. Her pain has been preventing her from doing her normal activities of daily living.  She has been having pain wake her up at night as well.  Her left shoulder has been starting to cause discomfort due to her having to make positional accommodations. She has tried a brace, voltaren gel, NSAIDs, and stretching exercises.  She requested a cortisone injection today in the office.  A referral to [facility] was made today for management of lateral epicondylitis.  She requested PT at this facility.  She was advised to continue to ice and use her brace.  -  Plan: Ambulatory referral to Physical Therapy  Pain in left elbow -She has been having significant discomfort of the lateral epicondyle of her left elbow.  She requested a x-ray of her left elbow today.  X-ray of her left elbow was unremarkable.  She is going to be referred for physical therapy for management of her lateral epicondylitis.  Plan: XR Elbow 2 Views Left   ANA positive  Primary osteoarthritis of both hands: She has PIP and DIP synovial thickening consistent with zoster arthritis.  She has no synovitis on exam.  She is no discomfort at this time.  Joint reduction muscle strengthening were discussed.  Primary osteoarthritis of both feet: She has no discomfort in her feet at this time.  Spondylosis of lumbar region without myelopathy or radiculopathy: No  midline spinal tenderness.  Good range of motion on exam.  She has no discomfort at this time.  DDD (degenerative disc disease), cervical: She continues to perform exercises on a regular basis.  She also uses a TENS unit at home.  Her pain continues to improve significantly.  History of TMJ disorder  History of bunionectomy     Orders: Orders Placed This Encounter  Procedures  . Medium Joint Inj  . XR Elbow 2 Views Left  . Ambulatory referral to Physical Therapy   Meds ordered this encounter  Medications  . diclofenac sodium (VOLTAREN) 1 % GEL    Sig: 3 grams to 3 large joints up to 3 times daily    Dispense:  3 Tube    Refill:  3    Face-to-face time spent with patient was 30 minutes. >50% of time was spent in counseling and coordination of care.  Follow-Up Instructions: Return for Osteoarthritis, Sicca syndrome .   Ofilia Neas, PA-C I examined and evaluated the patient with Hazel Sams PA. Patient has severe tenderness over her right lateral epicondyle on me exam. Her elbow xray was unremarkable. I injected the lateral epicondyle per her request. The procedure is described above. We will refer her to PT.The plan of care was discussed as noted above.  Bo Merino, MD Note - This record has been created using Editor, commissioning.  Chart creation errors have been sought, but may not always  have been located. Such creation errors do not reflect on  the standard of medical care.

## 2017-07-24 ENCOUNTER — Encounter: Payer: Self-pay | Admitting: Physician Assistant

## 2017-07-24 ENCOUNTER — Ambulatory Visit (INDEPENDENT_AMBULATORY_CARE_PROVIDER_SITE_OTHER): Payer: Self-pay

## 2017-07-24 ENCOUNTER — Ambulatory Visit: Payer: BLUE CROSS/BLUE SHIELD | Admitting: Rheumatology

## 2017-07-24 VITALS — BP 128/67 | HR 79 | Resp 14 | Ht 65.0 in | Wt 116.0 lb

## 2017-07-24 DIAGNOSIS — M25522 Pain in left elbow: Secondary | ICD-10-CM | POA: Diagnosis not present

## 2017-07-24 DIAGNOSIS — M19042 Primary osteoarthritis, left hand: Secondary | ICD-10-CM | POA: Diagnosis not present

## 2017-07-24 DIAGNOSIS — M5030 Other cervical disc degeneration, unspecified cervical region: Secondary | ICD-10-CM

## 2017-07-24 DIAGNOSIS — M47816 Spondylosis without myelopathy or radiculopathy, lumbar region: Secondary | ICD-10-CM

## 2017-07-24 DIAGNOSIS — R768 Other specified abnormal immunological findings in serum: Secondary | ICD-10-CM

## 2017-07-24 DIAGNOSIS — M7712 Lateral epicondylitis, left elbow: Secondary | ICD-10-CM | POA: Diagnosis not present

## 2017-07-24 DIAGNOSIS — M19072 Primary osteoarthritis, left ankle and foot: Secondary | ICD-10-CM | POA: Diagnosis not present

## 2017-07-24 DIAGNOSIS — Z9889 Other specified postprocedural states: Secondary | ICD-10-CM | POA: Diagnosis not present

## 2017-07-24 DIAGNOSIS — M19041 Primary osteoarthritis, right hand: Secondary | ICD-10-CM

## 2017-07-24 DIAGNOSIS — Z8739 Personal history of other diseases of the musculoskeletal system and connective tissue: Secondary | ICD-10-CM

## 2017-07-24 DIAGNOSIS — M3500 Sicca syndrome, unspecified: Secondary | ICD-10-CM | POA: Diagnosis not present

## 2017-07-24 DIAGNOSIS — M19071 Primary osteoarthritis, right ankle and foot: Secondary | ICD-10-CM

## 2017-07-24 MED ORDER — LIDOCAINE HCL 1 % IJ SOLN
1.0000 mL | INTRAMUSCULAR | Status: AC | PRN
  Administered 2017-07-24: 1 mL

## 2017-07-24 MED ORDER — TRIAMCINOLONE ACETONIDE 40 MG/ML IJ SUSP
30.0000 mg | INTRAMUSCULAR | Status: AC | PRN
  Administered 2017-07-24: 30 mg via INTRA_ARTICULAR

## 2017-07-24 MED ORDER — DICLOFENAC SODIUM 1 % TD GEL: TRANSDERMAL | 3 refills | Status: AC

## 2017-08-01 ENCOUNTER — Telehealth: Payer: Self-pay | Admitting: Rheumatology

## 2017-08-01 NOTE — Telephone Encounter (Signed)
Faxed

## 2017-08-01 NOTE — Telephone Encounter (Signed)
Butch Penny from [facility] called stating that they need a signed prescription for the patient's PT.  She requested that we faxed this over to her at [phone].  Thank you.

## 2018-03-04 NOTE — Progress Notes (Signed)
Office Visit Note  Patient: Hailey Duncan             Date of Birth: 07-03-1970           MRN: [id number]             PCP: Molli Posey, MD Referring: No ref. provider found Visit Date: 03/18/2018 Occupation: @GUAROCC @  Subjective:  Right shoulder and right knee pain.   History of Present Illness: Hailey Duncan is a 47 y.o. female history of sicca syndrome and osteoarthritis.  She states she has been having some problems with dry mouth and dry eyes.  Although the symptoms have improved this month.  She has been experiencing pain and stiffness in her right shoulder and difficulty lifting her right arm.  She also developed swelling in her right knee joint about 2 weeks ago.  Which has gone down some.  She states her right knee joint was visibly swollen.  She has been very active and doing a lot of activities in the home.  She had physical therapy for her left lateral epicondylitis which is improving.  Denies any discomfort in her neck or lower back currently.  Activities of Daily Living:  Patient reports morning stiffness for 1 hour.   Patient Reports nocturnal pain.  Difficulty dressing/grooming: Denies Difficulty climbing stairs: Reports Difficulty getting out of chair: Denies Difficulty using hands for taps, buttons, cutlery, and/or writing: Denies  Review of Systems  Constitutional: Negative for fatigue.  HENT: Negative for mouth sores, trouble swallowing, trouble swallowing and mouth dryness.   Eyes: Negative for pain, redness, itching and dryness.  Respiratory: Negative for shortness of breath, wheezing and difficulty breathing.   Cardiovascular: Negative for chest pain, palpitations and swelling in legs/feet.  Gastrointestinal: Negative for abdominal pain, blood in stool, constipation, diarrhea, nausea and vomiting.  Endocrine: Negative for increased urination.  Genitourinary: Negative for painful urination, nocturia and pelvic pain.  Musculoskeletal: Positive for  arthralgias, joint pain, joint swelling and morning stiffness.  Skin: Negative for rash and hair loss.  Allergic/Immunologic: Negative for susceptible to infections.  Neurological: Negative for dizziness, light-headedness, headaches, memory loss and weakness.  Hematological: Negative for bruising/bleeding tendency.  Psychiatric/Behavioral: Negative for confusion. The patient is not nervous/anxious.     PMFS History:  Patient Active Problem List   Diagnosis Date Noted  . Sicca syndrome, unspecified ([place]) 03/14/2016  . Primary osteoarthritis of both feet 03/14/2016  . Primary osteoarthritis of both hands 03/14/2016  . Spondylosis of lumbar region without myelopathy or radiculopathy 03/14/2016    Past Medical History:  Diagnosis Date  . Cervical arthritis   . Osteoarthritis   . Sicca syndrome, unspecified ([place])     Family History  Problem Relation Age of Onset  . Macular degeneration Mother   . Seizures Mother        under control   . Hypertension Mother   . Hypertension Father   . Hypercholesterolemia Father   . Tremor Father   . Hypercholesterolemia Sister    Past Surgical History:  Procedure Laterality Date  . BUNIONECTOMY     Social History   Social History Narrative  . Not on file    Objective: Vital Signs: BP 116/77 (BP Location: Left Arm, Patient Position: Sitting, Cuff Size: Normal)   Pulse 80   Resp 12   Ht 5\' 5"  (1.651 m)   Wt 114 lb 12.8 oz (52.1 kg)   BMI 19.10 kg/m    Physical Exam Vitals signs and nursing  note reviewed.  Constitutional:      Appearance: She is well-developed.  HENT:     Head: Normocephalic and atraumatic.  Eyes:     Conjunctiva/sclera: Conjunctivae normal.  Neck:     Musculoskeletal: Normal range of motion.  Cardiovascular:     Rate and Rhythm: Normal rate and regular rhythm.     Heart sounds: Normal heart sounds.  Pulmonary:     Effort: Pulmonary effort is normal.     Breath sounds: Normal breath sounds.  Abdominal:      General: Bowel sounds are normal.     Palpations: Abdomen is soft.  Lymphadenopathy:     Cervical: No cervical adenopathy.  Skin:    General: Skin is warm and dry.     Capillary Refill: Capillary refill takes less than 2 seconds.  Neurological:     Mental Status: She is alert and oriented to person, place, and time.  Psychiatric:        Behavior: Behavior normal.      Musculoskeletal Exam: C-spine thoracic lumbar spine good range of motion.  She had discomfort and painful range of motion of her right shoulder joint.  Left shoulder joint was in full range of motion.  Elbow joints wrist joint MCPs PIPs DIPs been good range of motion.  She has some thickening of PIP and DIP joints.  Hip joints knee joints ankles MTPs PIPs been good range of motion.  No warmth swelling effusion was noted over the right knee joint.  CDAI Exam: CDAI Score: Not documented Patient Global Assessment: Not documented; Provider Global Assessment: Not documented Swollen: Not documented; Tender: Not documented Joint Exam   Not documented   There is currently no information documented on the homunculus. Go to the Rheumatology activity and complete the homunculus joint exam.  Investigation: No additional findings.  Imaging: No results found.  Recent Labs: Lab Results  Component Value Date   WBC 13.6 (H) 09/15/2010   HGB 11.8 (L) 09/15/2010   PLT 129 (L) 09/15/2010    Speciality Comments: No specialty comments available.  Procedures:  Large Joint Inj: R glenohumeral on 03/18/2018 9:57 AM Indications: pain Details: 27 G 1.5 in needle, posterior approach  Arthrogram: No  Medications: 1 mL lidocaine 1 %; 40 mg triamcinolone acetonide 40 MG/ML Aspirate: 0 mL Outcome: tolerated well, no immediate complications Procedure, treatment alternatives, risks and benefits explained, specific risks discussed. Consent was given by the patient. Immediately prior to procedure a time out was called to verify the  correct patient, procedure, equipment, support staff and site/side marked as required. Patient was prepped and draped in the usual sterile fashion.     Allergies: Clindamycin/lincomycin and Penicillins   Assessment / Plan:     Visit Diagnoses: Sicca syndrome, unspecified ([place]) -continues to have sicca symptoms but they are manageable with pilocarpine.  A prescription refill for pilocarpine was given today.  ANA positive -she has been experiencing increased joint pain and joint swelling.  I will obtain following labs today.  Plan: CBC with Differential/Platelet, COMPLETE METABOLIC PANEL WITH GFR, ANA, Sedimentation rate, Sjogrens syndrome-A extractable nuclear antibody, Sjogrens syndrome-B extractable nuclear antibody, Anti-DNA antibody, double-stranded, Anti-Smith antibody, RNP Antibody, C3 and C4, Anti-scleroderma antibody  Chronic right shoulder pain -she had painful range of motion of her right shoulder joint .  After different treatment options were discussed right shoulder joint was injected with cortisone as described above.  I will obtain following labs today.  I have given her a handout on shoulder exercises.Marland Kitchen  Plan: Rheumatoid factor, Cyclic citrul peptide antibody, IgG  Acute pain of right knee-patient gives history of right knee joint pain and swelling 2 weeks ago which gradually improved.  She had no warmth or swelling on examination today.  I have advised her to contact us in case her symptoms recur.  A handout on knee joint exercises was given.  She was also advised to use topical diclofenac.  Lateral epicondylitis, left elbow -  A referral to [facility] was made during the last visit.  Patient has noticed improvement in her symptoms.  She is very active and does a lot of repetitive motion.  Primary osteoarthritis of both hands-some stiffness in her hands.  Primary osteoarthritis of both feet-she is currently not having much discomfort in her feet.  DDD  (degenerative disc disease), cervical - She also uses a TENS unit at home.   Spondylosis of lumbar region without myelopathy or radiculopathy  History of bunionectomy  History of TMJ disorder   Orders: Orders Placed This Encounter  Procedures  . Large Joint Inj  . CBC with Differential/Platelet  . COMPLETE METABOLIC PANEL WITH GFR  . Rheumatoid factor  . Cyclic citrul peptide antibody, IgG  . ANA  . Sedimentation rate  . Sjogrens syndrome-A extractable nuclear antibody  . Sjogrens syndrome-B extractable nuclear antibody  . Anti-DNA antibody, double-stranded  . Anti-Smith antibody  . RNP Antibody  . C3 and C4  . Anti-scleroderma antibody   Meds ordered this encounter  Medications  . pilocarpine (SALAGEN) 5 MG tablet    Sig: TAKE 1 TABLET BY MOUTH UP TO THREE TIMES DAILY    Dispense:  90 tablet    Refill:  2    Face-to-face time spent with patient was 30 minutes. Greater than 50% of time was spent in counseling and coordination of care.  Follow-Up Instructions: Return in about 6 months (around 09/17/2018).   Bo Merino, MD  Note - This record has been created using Editor, commissioning.  Chart creation errors have been sought, but may not always  have been located. Such creation errors do not reflect on  the standard of medical care.

## 2018-03-18 ENCOUNTER — Ambulatory Visit: Payer: BLUE CROSS/BLUE SHIELD | Admitting: Rheumatology

## 2018-03-18 ENCOUNTER — Encounter: Payer: Self-pay | Admitting: Rheumatology

## 2018-03-18 VITALS — BP 116/77 | HR 80 | Resp 12 | Ht 65.0 in | Wt 114.8 lb

## 2018-03-18 DIAGNOSIS — M5030 Other cervical disc degeneration, unspecified cervical region: Secondary | ICD-10-CM

## 2018-03-18 DIAGNOSIS — M3500 Sicca syndrome, unspecified: Secondary | ICD-10-CM | POA: Diagnosis not present

## 2018-03-18 DIAGNOSIS — M25561 Pain in right knee: Secondary | ICD-10-CM

## 2018-03-18 DIAGNOSIS — G8929 Other chronic pain: Secondary | ICD-10-CM | POA: Diagnosis not present

## 2018-03-18 DIAGNOSIS — M47816 Spondylosis without myelopathy or radiculopathy, lumbar region: Secondary | ICD-10-CM

## 2018-03-18 DIAGNOSIS — R768 Other specified abnormal immunological findings in serum: Secondary | ICD-10-CM

## 2018-03-18 DIAGNOSIS — M25511 Pain in right shoulder: Secondary | ICD-10-CM | POA: Diagnosis not present

## 2018-03-18 DIAGNOSIS — Z9889 Other specified postprocedural states: Secondary | ICD-10-CM

## 2018-03-18 DIAGNOSIS — M19042 Primary osteoarthritis, left hand: Secondary | ICD-10-CM

## 2018-03-18 DIAGNOSIS — M7712 Lateral epicondylitis, left elbow: Secondary | ICD-10-CM

## 2018-03-18 DIAGNOSIS — M19041 Primary osteoarthritis, right hand: Secondary | ICD-10-CM

## 2018-03-18 DIAGNOSIS — M19071 Primary osteoarthritis, right ankle and foot: Secondary | ICD-10-CM

## 2018-03-18 DIAGNOSIS — Z8739 Personal history of other diseases of the musculoskeletal system and connective tissue: Secondary | ICD-10-CM

## 2018-03-18 DIAGNOSIS — M19072 Primary osteoarthritis, left ankle and foot: Secondary | ICD-10-CM

## 2018-03-18 MED ORDER — PILOCARPINE HCL 5 MG PO TABS: ORAL_TABLET | ORAL | 2 refills | Status: DC

## 2018-03-18 MED ORDER — LIDOCAINE HCL 1 % IJ SOLN
1.0000 mL | INTRAMUSCULAR | Status: AC | PRN
  Administered 2018-03-18: 1 mL

## 2018-03-18 MED ORDER — TRIAMCINOLONE ACETONIDE 40 MG/ML IJ SUSP
40.0000 mg | INTRAMUSCULAR | Status: AC | PRN
  Administered 2018-03-18: 40 mg via INTRA_ARTICULAR

## 2018-03-18 NOTE — Patient Instructions (Signed)
Knee Exercises                        Ask your health care provider which exercises are safe for you. Do exercises exactly as told by your health care provider and adjust them as directed. It is normal to feel mild stretching, pulling, tightness, or discomfort as you do these exercises, but you should stop right away if you feel sudden pain or your pain gets worse.Do not begin these exercises until told by your health care provider.  STRETCHING AND RANGE OF MOTION EXERCISES  These exercises warm up your muscles and joints and improve the movement and flexibility of your knee. These exercises also help to relieve pain, numbness, and tingling.  Exercise A: Knee Extension, Prone  1. Lie on your abdomen on a bed.  2. Place your left / right knee just beyond the edge of the surface so your knee is not on the bed. You can put a towel under your left / right thigh just above your knee for comfort.  3. Relax your leg muscles and allow gravity to straighten your knee. You should feel a stretch behind your left / right knee.  4. Hold this position for __________ seconds.  5. Scoot up so your knee is supported between repetitions.  Repeat __________ times. Complete this stretch __________ times a day.  Exercise B: Knee Flexion, Active  1. Lie on your back with both knees straight. If this causes back discomfort, bend your left / right knee so your foot is flat on the floor.  2. Slowly slide your left / right heel back toward your buttocks until you feel a gentle stretch in the front of your knee or thigh.  3. Hold this position for __________ seconds.  4. Slowly slide your left / right heel back to the starting position.  Repeat __________ times. Complete this exercise __________ times a day.  Exercise C: Quadriceps, Prone  1. Lie on your abdomen on a firm surface, such as a bed or padded floor.  2. Bend your left / right knee and hold your ankle. If you cannot reach your ankle or pant leg, loop a belt around your foot and  grab the belt instead.  3. Gently pull your heel toward your buttocks. Your knee should not slide out to the side. You should feel a stretch in the front of your thigh and knee.  4. Hold this position for __________ seconds.  Repeat __________ times. Complete this stretch __________ times a day.  Exercise D: Hamstring, Supine  1. Lie on your back.  2. Loop a belt or towel over the ball of your left / right foot. The ball of your foot is on the walking surface, right under your toes.  3. Straighten your left / right knee and slowly pull on the belt to raise your leg until you feel a gentle stretch behind your knee.  ? Do not let your left / right knee bend while you do this.  ? Keep your other leg flat on the floor.  4. Hold this position for __________ seconds.  Repeat __________ times. Complete this stretch __________ times a day.  STRENGTHENING EXERCISES  These exercises build strength and endurance in your knee. Endurance is the ability to use your muscles for a long time, even after they get tired.  Exercise E: Quadriceps, Isometric  1. Lie on your back with your left / right leg extended and your   other knee bent. Put a rolled towel or small pillow under your knee if told by your health care provider.  2. Slowly tense the muscles in the front of your left / right thigh. You should see your kneecap slide up toward your hip or see increased dimpling just above the knee. This motion will push the back of the knee toward the floor.  3. For __________ seconds, keep the muscle as tight as you can without increasing your pain.  4. Relax the muscles slowly and completely.  Repeat __________ times. Complete this exercise __________ times a day.  Exercise F: Straight Leg Raises - Quadriceps  1. Lie on your back with your left / right leg extended and your other knee bent.  2. Tense the muscles in the front of your left / right thigh. You should see your kneecap slide up or see increased dimpling just above the knee. Your  thigh may even shake a bit.  3. Keep these muscles tight as you raise your leg 4-6 inches (10-15 cm) off the floor. Do not let your knee bend.  4. Hold this position for __________ seconds.  5. Keep these muscles tense as you lower your leg.  6. Relax your muscles slowly and completely after each repetition.  Repeat __________ times. Complete this exercise __________ times a day.  Exercise G: Hamstring, Isometric  1. Lie on your back on a firm surface.  2. Bend your left / right knee approximately __________ degrees.  3. Dig your left / right heel into the surface as if you are trying to pull it toward your buttocks. Tighten the muscles in the back of your thighs to dig as hard as you can without increasing any pain.  4. Hold this position for __________ seconds.  5. Release the tension gradually and allow your muscles to relax completely for __________ seconds after each repetition.  Repeat __________ times. Complete this exercise __________ times a day.  Exercise H: Hamstring Curls  If told by your health care provider, do this exercise while wearing ankle weights. Begin with __________ weights. Then increase the weight by 1 lb (0.5 kg) increments. Do not wear ankle weights that are more than __________.  1. Lie on your abdomen with your legs straight.  2. Bend your left / right knee as far as you can without feeling pain. Keep your hips flat against the floor.  3. Hold this position for __________ seconds.  4. Slowly lower your leg to the starting position.  Repeat __________ times. Complete this exercise __________ times a day.  Exercise I: Squats (Quadriceps)  1. Stand in front of a table, with your feet and knees pointing straight ahead. You may rest your hands on the table for balance but not for support.  2. Slowly bend your knees and lower your hips like you are going to sit in a chair.  ? Keep your weight over your heels, not over your toes.  ? Keep your lower legs upright so they are parallel with the  table legs.  ? Do not let your hips go lower than your knees.  ? Do not bend lower than told by your health care provider.  ? If your knee pain increases, do not bend as low.  3. Hold the squat position for __________ seconds.  4. Slowly push with your legs to return to standing. Do not use your hands to pull yourself to standing.  Repeat __________ times. Complete this exercise __________ times a   day. Exercise K: Straight Leg Raises - Hip Abductors 1. Lie on your side with your left / right leg in the top position. Lie so your head, shoulder, knee, and hip line up. You may bend your bottom knee to help you keep your balance. 2. Roll your hips slightly forward so your hips are stacked directly over each other and your left / right knee is facing forward. 3. Leading with your heel, lift your top leg 4-6 inches (10-15 cm). You should feel the muscles in your outer hip lifting. ? Do not let your foot drift forward. ? Do not let your knee roll toward the ceiling. 4. Hold this position for __________ seconds. 5. Slowly return your leg to the starting position. 6. Let your muscles relax completely after each repetition. Repeat __________ times. Complete this exercise __________ times a day. Exercise L: Straight Leg Raises - Hip Extensors 1. Lie on your abdomen on a firm surface. You can put a pillow under your hips if that is more comfortable. 2. Tense the muscles in your buttocks and lift your left / right leg about 4-6 inches (10-15 cm). Keep your  knee straight as you lift your leg. 3. Hold this position for __________ seconds. 4. Slowly lower your leg to the starting position. 5. Let your leg relax completely after each repetition. Repeat __________ times. Complete this exercise __________ times a day. This information is not intended to replace advice given to you by your health care provider. Make sure you discuss any questions you have with your health care provider. Document Released: 01/30/2005 Document Revised: 12/11/2015 Document Reviewed: 01/22/2015 [employer] Interactive Patient Education  2019 [employer] Shoulder Exercises Ask your health care provider which exercises are safe for you. Do exercises exactly as told by your health care provider and adjust them as directed. It is normal to feel mild stretching, pulling, tightness, or discomfort as you do these exercises, but you should stop right away if you feel sudden pain or your pain gets worse.Do not begin these exercises until told by your health care provider. Range of Motion Exercises        These exercises warm up your muscles and joints and improve the movement and flexibility of your shoulder. These exercises also help to relieve pain, numbness, and tingling. These exercises involve stretching your injured shoulder directly. Exercise A: Pendulum 1. Stand near a wall or a surface that you can hold onto for balance. 2. Bend at the waist and let your left / right arm hang straight down. Use your other arm to support you. Keep your back straight and do not lock your knees. 3. Relax your left / right arm and shoulder muscles, and move your hips and your trunk so your left / right arm swings freely. Your arm should swing because of the motion of your body, not because you are using your arm or shoulder muscles. 4. Keep moving your body so your arm swings in the following directions, as told by your health care provider: ? Side to side. ? Forward and backward. ? In  clockwise and counterclockwise circles. 5. Continue each motion for __________ seconds, or for as long as told by your health care provider. 6. Slowly return to the starting position. Repeat __________ times. Complete this exercise __________ times a day. Exercise B:Flexion, Standing 1. Stand and hold a broomstick, a cane, or a similar object. Place your hands a little more than shoulder-width apart on the object. Your left / right  hand should be palm-up, and your other hand should be palm-down. 2. Keep your elbow straight and keep your shoulder muscles relaxed. Push the stick down with your healthy arm to raise your left / right arm in front of your body, and then over your head until you feel a stretch in your shoulder. ? Avoid shrugging your shoulder while you raise your arm. Keep your shoulder blade tucked down toward the middle of your back. 3. Hold for __________ seconds. 4. Slowly return to the starting position. Repeat __________ times. Complete this exercise __________ times a day. Exercise C: Abduction, Standing 1. Stand and hold a broomstick, a cane, or a similar object. Place your hands a little more than shoulder-width apart on the object. Your left / right hand should be palm-up, and your other hand should be palm-down. 2. While keeping your elbow straight and your shoulder muscles relaxed, push the stick across your body toward your left / right side. Raise your left / right arm to the side of your body and then over your head until you feel a stretch in your shoulder. ? Do not raise your arm above shoulder height, unless your health care provider tells you to do that. ? Avoid shrugging your shoulder while you raise your arm. Keep your shoulder blade tucked down toward the middle of your back. 3. Hold for __________ seconds. 4. Slowly return to the starting position. Repeat __________ times. Complete this exercise __________ times a day. Exercise D:Internal Rotation 1. Place your  left / right hand behind your back, palm-up. 2. Use your other hand to dangle an exercise band, a towel, or a similar object over your shoulder. Grasp the band with your left / right hand so you are holding onto both ends. 3. Gently pull up on the band until you feel a stretch in the front of your left / right shoulder. ? Avoid shrugging your shoulder while you raise your arm. Keep your shoulder blade tucked down toward the middle of your back. 4. Hold for __________ seconds. 5. Release the stretch by letting go of the band and lowering your hands. Repeat __________ times. Complete this exercise __________ times a day. Stretching Exercises  These exercises warm up your muscles and joints and improve the movement and flexibility of your shoulder. These exercises also help to relieve pain, numbness, and tingling. These exercises are done using your healthy shoulder to help stretch the muscles of your injured shoulder. Exercise E: Warehouse manager (External Rotation and Abduction) 1. Stand in a doorway with one of your feet slightly in front of the other. This is called a staggered stance. If you cannot reach your forearms to the door frame, stand facing a corner of a room. 2. Choose one of the following positions as told by your health care provider: ? Place your hands and forearms on the door frame above your head. ? Place your hands and forearms on the door frame at the height of your head. ? Place your hands on the door frame at the height of your elbows. 3. Slowly move your weight onto your front foot until you feel a stretch across your chest and in the front of your shoulders. Keep your head and chest upright and keep your abdominal muscles tight. 4. Hold for __________ seconds. 5. To release the stretch, shift your weight to your back foot. Repeat __________ times. Complete this stretch __________ times a day. Exercise F:Extension, Standing 1. Stand and hold a broomstick, a cane,  or a  similar object behind your back. ? Your hands should be a little wider than shoulder-width apart. ? Your palms should face away from your back. 2. Keeping your elbows straight and keeping your shoulder muscles relaxed, move the stick away from your body until you feel a stretch in your shoulder. ? Avoid shrugging your shoulders while you move the stick. Keep your shoulder blade tucked down toward the middle of your back. 3. Hold for __________ seconds. 4. Slowly return to the starting position. Repeat __________ times. Complete this exercise __________ times a day. Strengthening Exercises           These exercises build strength and endurance in your shoulder. Endurance is the ability to use your muscles for a long time, even after they get tired. Exercise G:External Rotation 1. Sit in a stable chair without armrests. 2. Secure an exercise band at elbow height on your left / right side. 3. Place a soft object, such as a folded towel or a small pillow, between your left / right upper arm and your body to move your elbow a few inches away (about 10 cm) from your side. 4. Hold the end of the band so it is tight and there is no slack. 5. Keeping your elbow pressed against the soft object, move your left / right forearm out, away from your abdomen. Keep your body steady so only your forearm moves. 6. Hold for __________ seconds. 7. Slowly return to the starting position. Repeat __________ times. Complete this exercise __________ times a day. Exercise H:Shoulder Abduction 1. Sit in a stable chair without armrests, or stand. 2. Hold a __________ weight in your left / right hand, or hold an exercise band with both hands. 3. Start with your arms straight down and your left / right palm facing in, toward your body. 4. Slowly lift your left / right hand out to your side. Do not lift your hand above shoulder height unless your health care provider tells you that this is safe. ? Keep your arms  straight. ? Avoid shrugging your shoulder while you do this movement. Keep your shoulder blade tucked down toward the middle of your back. 5. Hold for __________ seconds. 6. Slowly lower your arm, and return to the starting position. Repeat __________ times. Complete this exercise __________ times a day. Exercise I:Shoulder Extension 1. Sit in a stable chair without armrests, or stand. 2. Secure an exercise band to a stable object in front of you where it is at shoulder height. 3. Hold one end of the exercise band in each hand. Your palms should face each other. 4. Straighten your elbows and lift your hands up to shoulder height. 5. Step back, away from the secured end of the exercise band, until the band is tight and there is no slack. 6. Squeeze your shoulder blades together as you pull your hands down to the sides of your thighs. Stop when your hands are straight down by your sides. Do not let your hands go behind your body. 7. Hold for __________ seconds. 8. Slowly return to the starting position. Repeat __________ times. Complete this exercise __________ times a day. Exercise J:Standing Shoulder Row 1. Sit in a stable chair without armrests, or stand. 2. Secure an exercise band to a stable object in front of you so it is at waist height. 3. Hold one end of the exercise band in each hand. Your palms should be in a thumbs-up position. 4. Bend each of your  elbows to an "L" shape (about 90 degrees) and keep your upper arms at your sides. 5. Step back until the band is tight and there is no slack. 6. Slowly pull your elbows back behind you. 7. Hold for __________ seconds. 8. Slowly return to the starting position. Repeat __________ times. Complete this exercise __________ times a day. Exercise K:Shoulder Press-Ups 1. Sit in a stable chair that has armrests. Sit upright, with your feet flat on the floor. 2. Put your hands on the armrests so your elbows are bent and your fingers are  pointing forward. Your hands should be about even with the sides of your body. 3. Push down on the armrests and use your arms to lift yourself off of the chair. Straighten your elbows and lift yourself up as much as you comfortably can. ? Move your shoulder blades down, and avoid letting your shoulders move up toward your ears. ? Keep your feet on the ground. As you get stronger, your feet should support less of your body weight as you lift yourself up. 4. Hold for __________ seconds. 5. Slowly lower yourself back into the chair. Repeat __________ times. Complete this exercise __________ times a day. Exercise L: Wall Push-Ups 1. Stand so you are facing a stable wall. Your feet should be about one arm-length away from the wall. 2. Lean forward and place your palms on the wall at shoulder height. 3. Keep your feet flat on the floor as you bend your elbows and lean forward toward the wall. 4. Hold for __________ seconds. 5. Straighten your elbows to push yourself back to the starting position. Repeat __________ times. Complete this exercise __________ times a day. This information is not intended to replace advice given to you by your health care provider. Make sure you discuss any questions you have with your health care provider. Document Released: 01/30/2005 Document Revised: 07/22/2017 Document Reviewed: 11/27/2014 [employer] Interactive Patient Education  2019 [employer].

## 2018-03-20 LAB — COMPLETE METABOLIC PANEL WITH GFR
AG RATIO: 1.5 (calc) (ref 1.0–2.5)
ALKALINE PHOSPHATASE (APISO): 37 U/L (ref 33–115)
ALT: 17 U/L (ref 6–29)
AST: 19 U/L (ref 10–35)
Albumin: 4.7 g/dL (ref 3.6–5.1)
BILIRUBIN TOTAL: 0.7 mg/dL (ref 0.2–1.2)
BUN: 13 mg/dL (ref 7–25)
CHLORIDE: 99 mmol/L (ref 98–110)
CO2: 28 mmol/L (ref 20–32)
Calcium: 10.1 mg/dL (ref 8.6–10.2)
Creat: 0.73 mg/dL (ref 0.50–1.10)
GFR, Est African American: 114 mL/min/{1.73_m2} (ref >=60)
GFR, Est Non African American: 99 mL/min/{1.73_m2} (ref >=60)
GLOBULIN: 3.1 g/dL (ref 1.9–3.7)
Glucose, Bld: 74 mg/dL (ref 65–99)
POTASSIUM: 4 mmol/L (ref 3.5–5.3)
SODIUM: 136 mmol/L (ref 135–146)
Total Protein: 7.8 g/dL (ref 6.1–8.1)

## 2018-03-20 LAB — CBC WITH DIFFERENTIAL/PLATELET
Absolute Monocytes: 355 cells/uL (ref 200–950)
Basophils Absolute: 20 cells/uL (ref 0–200)
Basophils Relative: 0.5 %
EOS ABS: 31 {cells}/uL (ref 15–500)
Eosinophils Relative: 0.8 %
HEMATOCRIT: 42.5 % (ref 35.0–45.0)
HEMOGLOBIN: 14.8 g/dL (ref 11.7–15.5)
LYMPHS ABS: 1104 {cells}/uL (ref 850–3900)
MCH: 31.2 pg (ref 27.0–33.0)
MCHC: 34.8 g/dL (ref 32.0–36.0)
MCV: 89.7 fL (ref 80.0–100.0)
MPV: 10.6 fL (ref 7.5–12.5)
Monocytes Relative: 9.1 %
NEUTROS ABS: 2391 {cells}/uL (ref 1500–7800)
Neutrophils Relative %: 61.3 %
Platelets: 230 10*3/uL (ref 140–400)
RBC: 4.74 10*6/uL (ref 3.80–5.10)
RDW: 11.7 % (ref 11.0–15.0)
Total Lymphocyte: 28.3 %
WBC: 3.9 10*3/uL (ref 3.8–10.8)

## 2018-03-20 LAB — SJOGRENS SYNDROME-A EXTRACTABLE NUCLEAR ANTIBODY: SSA (Ro) (ENA) Antibody, IgG: <1 AI

## 2018-03-20 LAB — C3 AND C4
C3 Complement: 121 mg/dL (ref 83–193)
C4 Complement: 16 mg/dL (ref 15–57)

## 2018-03-20 LAB — ANTI-SCLERODERMA ANTIBODY: Scleroderma (Scl-70) (ENA) Antibody, IgG: <1 AI

## 2018-03-20 LAB — ANA: Anti Nuclear Antibody(ANA): NEGATIVE

## 2018-03-20 LAB — SJOGRENS SYNDROME-B EXTRACTABLE NUCLEAR ANTIBODY: SSB (La) (ENA) Antibody, IgG: <1 AI

## 2018-03-20 LAB — ANTI-DNA ANTIBODY, DOUBLE-STRANDED: ds DNA Ab: 3 IU/mL

## 2018-03-20 LAB — RNP ANTIBODY: Ribonucleic Protein(ENA) Antibody, IgG: <1 AI

## 2018-03-20 LAB — ANTI-SMITH ANTIBODY: ENA SM Ab Ser-aCnc: <1 AI

## 2018-03-20 LAB — SEDIMENTATION RATE: Sed Rate: 2 mm/h (ref 0–20)

## 2018-03-20 LAB — RHEUMATOID FACTOR

## 2018-03-20 LAB — CYCLIC CITRUL PEPTIDE ANTIBODY, IGG: Cyclic Citrullin Peptide Ab: <16 UNITS

## 2018-03-20 NOTE — Progress Notes (Signed)
All labs are WNLs

## 2018-06-23 ENCOUNTER — Other Ambulatory Visit: Payer: Self-pay | Admitting: Rheumatology

## 2018-06-23 NOTE — Telephone Encounter (Signed)
Last visit: 03/18/18 Next visit: 09/17/18  Okay to refill per Dr. Deveshwar  

## 2018-09-03 NOTE — Progress Notes (Signed)
Office Visit Note  Patient: Hailey Duncan             Date of Birth: [birth year]           MRN: [id number]             PCP: Molli Posey, MD Referring: Molli Posey, MD Visit Date: 09/17/2018 Occupation: @GUAROCC @  Subjective:  Left knee joint pain   History of Present Illness: Hailey Duncan is a 48 y.o. female with history of sicca syndrome, Positive ANA, and osteoarthritis. She reports that the past 1 week she has been having increased left knee joint pain.  She denies any warmth or swelling.  She states that she has been performing yard work and has been more active recently but she feels this contributing to the left knee joint pain.  She states that she has been squatting and kneeling in her garden which has been exacerbating the discomfort.  She has not been using Voltaren gel topically.  She reports that her left lateral epicondylitis has improved.  She states that while homeschooling her children this past spring she had some increased tenderness which has since subsided.  She denies any pain in her hands.  She denies any joint swelling.  She states that her grip strength has improved.  She reports that she has been experiencing skin sensitivity under her armpits.  She states that she is no longer able to wear deodorant.  She states she tried wearing any children still had hypersensitivity reaction.  She states that she has a burning sensation and itching after shaving as well.  Has not followed up with her dermatologist recently  She denies any other rashes.  She states she had one episode of photosensitivity this past spring.  She continues to wear sunscreen daily.  She denies any symptoms of Raynaud's.  She continues to have sicca symptoms.  She takes pilocarpine 5 mg half tablet in the morning half tablet in the evening for symptomatic relief    Activities of Daily Living:  Patient reports morning stiffness for 10 minutes.   Patient Reports nocturnal pain.  Difficulty  dressing/grooming: Denies Difficulty climbing stairs: Denies Difficulty getting out of chair: Denies Difficulty using hands for taps, buttons, cutlery, and/or writing: Denies  Review of Systems  Constitutional: Positive for fatigue.  HENT: Negative for mouth sores, mouth dryness and nose dryness.   Eyes: Positive for dryness. Negative for pain and visual disturbance.  Respiratory: Negative for cough, hemoptysis, shortness of breath, wheezing and difficulty breathing.   Cardiovascular: Negative for chest pain, palpitations, hypertension and swelling in legs/feet.  Gastrointestinal: Negative for blood in stool, constipation and diarrhea.  Endocrine: Negative for excessive thirst and increased urination.  Genitourinary: Negative for difficulty urinating and painful urination.  Musculoskeletal: Positive for arthralgias, joint pain and morning stiffness. Negative for joint swelling, myalgias, muscle weakness, muscle tenderness and myalgias.  Skin: Negative for color change, pallor, rash, hair loss, nodules/bumps, redness, skin tightness, ulcers and sensitivity to sunlight.  Allergic/Immunologic: Negative for susceptible to infections.  Neurological: Negative for dizziness, numbness, headaches and weakness.  Hematological: Negative for bruising/bleeding tendency and swollen glands.  Psychiatric/Behavioral: Positive for sleep disturbance. Negative for depressed mood. The patient is not nervous/anxious.     PMFS History:  Patient Active Problem List   Diagnosis Date Noted  . Sicca syndrome, unspecified ([place]) 03/14/2016  . Primary osteoarthritis of both feet 03/14/2016  . Primary osteoarthritis of both hands 03/14/2016  . Spondylosis of lumbar region without  myelopathy or radiculopathy 03/14/2016    Past Medical History:  Diagnosis Date  . Cervical arthritis   . Osteoarthritis   . Sicca syndrome, unspecified ([place])     Family History  Problem Relation Age of Onset  . Macular degeneration  Mother   . Seizures Mother        under control   . Hypertension Mother   . Hypertension Father   . Hypercholesterolemia Father   . Tremor Father   . Hypercholesterolemia Sister    Past Surgical History:  Procedure Laterality Date  . BUNIONECTOMY     Social History   Social History Narrative  . Not on file    There is no immunization history on file for this patient.   Objective: Vital Signs: BP 111/67 (BP Location: Left Arm, Patient Position: Sitting, Cuff Size: Normal)   Pulse 87   Resp 12   Ht 5\' 5"  (1.651 m)   Wt 117 lb (53.1 kg)   LMP  (LMP Unknown)   BMI 19.47 kg/m    Physical Exam Vitals signs and nursing note reviewed.  Constitutional:      Appearance: She is well-developed.  HENT:     Head: Normocephalic and atraumatic.  Eyes:     Conjunctiva/sclera: Conjunctivae normal.  Neck:     Musculoskeletal: Normal range of motion.  Cardiovascular:     Rate and Rhythm: Normal rate and regular rhythm.     Heart sounds: Normal heart sounds.  Pulmonary:     Effort: Pulmonary effort is normal.     Breath sounds: Normal breath sounds.  Abdominal:     General: Bowel sounds are normal.     Palpations: Abdomen is soft.  Lymphadenopathy:     Cervical: No cervical adenopathy.  Skin:    General: Skin is warm and dry.     Capillary Refill: Capillary refill takes less than 2 seconds.  Neurological:     Mental Status: She is alert and oriented to person, place, and time.  Psychiatric:        Behavior: Behavior normal.      Musculoskeletal Exam: C-spine, thoracic spine, lumbar spine good range of motion.  No midline spinal tenderness.  No SI joint tenderness.  Shoulders, elbows, wrist joints, MCPs, PIPs and DIPs good range of motion no synovitis.  Tenderness over the left elbow lateral epicondyle.  She has complete fist formation bilaterally.  Mild DIP synovial thickening.  Hip joints, knee joints, ankle joints, MTPs, PIPs and DIPs good range of motion no synovitis.  No  warmth or effusion bilateral knee joints.  No tenderness or swelling of ankle joints.  No tenderness over trochanteric bursa bilaterally.  CDAI Exam: CDAI Score: - Patient Global: -; Provider Global: - Swollen: -; Tender: - Joint Exam   No joint exam has been documented for this visit   There is currently no information documented on the homunculus. Go to the Rheumatology activity and complete the homunculus joint exam.  Investigation: No additional findings.  Imaging: No results found.  Recent Labs: Lab Results  Component Value Date   WBC 3.9 03/18/2018   HGB 14.8 03/18/2018   PLT 230 03/18/2018   NA 136 03/18/2018   K 4.0 03/18/2018   CL 99 03/18/2018   CO2 28 03/18/2018   GLUCOSE 74 03/18/2018   BUN 13 03/18/2018   CREATININE 0.73 03/18/2018   BILITOT 0.7 03/18/2018   AST 19 03/18/2018   ALT 17 03/18/2018   PROT 7.8 03/18/2018   CALCIUM  10.1 03/18/2018   GFRAA 114 03/18/2018    Speciality Comments: No specialty comments available.  Procedures:  No procedures performed Allergies: Clindamycin/lincomycin and Penicillins   Assessment / Plan:     Visit Diagnoses: Sicca syndrome, unspecified ([place]) -Ro- and La- on 03/18/18.  She continues to have sicca symptoms.  She takes pilocarpine 5 mg half tablet by mouth in the morning and half tablet by mouth in the evening for symptomatic relief.  A refill of pilocarpine was sent today.  She was advised to notify us if she develops new or worsening symptoms.  She will follow-up in the office in 5 months.  ANA positive -ANA negative on 03/18/2018.  ENA negative, CCP negative, RF negative, sed rate within normal limits on 03/18/2017.  She has not developed any new or worsening features of autoimmune disease at this time.  She continues have sicca symptoms and takes pilocarpine 5 mg half tablet in the morning half tablet in the evening.  She had one episode of photosensitivity while wearing sunscreen this past spring.  She was  encouraged to wear sunscreen on a daily basis and large brim hats as well as sun protective clothing if she is going to be out in the sun.  No malar rash noted.  We will check autoimmune labs on yearly basis.  She was advised to notify us if she develops any new or worsening symptoms.  Primary osteoarthritis of both hands -She has mild DIP synovial thickening consistent with osteoarthritis of bilateral hands.  She is complete fist formation bilaterally.  She has no synovitis or tenderness on exam.  She has noticed improvement in her grip strength bilaterally.  Joint protection and muscle strengthening were discussed.  Primary osteoarthritis of both feet - She has no feet pain or joint swelling.  She wears proper fitting shoes.  Lateral epicondylitis, left elbow -She has intermittent tenderness.  Her left lateral epicondylitis was exacerbated while homeschooling her children and using the computer more frequently.  She was encouraged to perform stretching exercises on a regular basis.  She can also apply Voltaren gel topically.  DDD (degenerative disc disease), cervical - She has good range of motion with no discomfort.  She has no symptoms of radiculopathy at this time.  She also uses a TENS unit at home.  Spondylosis of lumbar region without myelopathy or radiculopathy -She has good range of motion.  No midline spinal tenderness.    Acute pain of left knee: She has been experiencing increased left knee joint pain for the past 1 week.  She denies any recent injuries or falls.  She has been performing yard work and been more active recently.  She has been squatting kneeling in the garden.  She has no warmth or effusion on exam.  She has good range of motion.  She declined x-rays or cortisone injection today.  She is encouraged use Voltaren gel topically as needed for pain relief.  We also discussed icing and elevating her knee after activities.  She can also try using an Ace wrap or compression sleeve  well gardening.  She is advised to notify us if she develops increased joint pain or joint swelling and  She will return for x-rays and a cortisone injection.  Other medical conditions are listed as follows:  History of TMJ disorder   History of bunionectomy   Orders: No orders of the defined types were placed in this encounter.  No orders of the defined types were placed in this encounter.  Face-to-face time spent with patient was 30 minutes. Greater than 50% of time was spent in counseling and coordination of care.  Follow-Up Instructions: Return in about 6 months (around 03/19/2019) for Osteoarthritis.   Ofilia Neas, PA-C   I examined and evaluated the patient with Hazel Sams PA.  She had no synovitis on my examination.  The plan of care was discussed as noted above.  Bo Merino, MD  Note - This record has been created using Editor, commissioning.  Chart creation errors have been sought, but may not always  have been located. Such creation errors do not reflect on  the standard of medical care.

## 2018-09-17 ENCOUNTER — Other Ambulatory Visit: Payer: Self-pay

## 2018-09-17 ENCOUNTER — Other Ambulatory Visit: Payer: Self-pay | Admitting: Rheumatology

## 2018-09-17 ENCOUNTER — Encounter: Payer: Self-pay | Admitting: Rheumatology

## 2018-09-17 ENCOUNTER — Ambulatory Visit: Payer: BLUE CROSS/BLUE SHIELD | Admitting: Rheumatology

## 2018-09-17 VITALS — BP 111/67 | HR 87 | Resp 12 | Ht 65.0 in | Wt 117.0 lb

## 2018-09-17 DIAGNOSIS — M19071 Primary osteoarthritis, right ankle and foot: Secondary | ICD-10-CM | POA: Diagnosis not present

## 2018-09-17 DIAGNOSIS — R7689 Other specified abnormal immunological findings in serum: Secondary | ICD-10-CM

## 2018-09-17 DIAGNOSIS — M5030 Other cervical disc degeneration, unspecified cervical region: Secondary | ICD-10-CM

## 2018-09-17 DIAGNOSIS — M3500 Sicca syndrome, unspecified: Secondary | ICD-10-CM | POA: Diagnosis not present

## 2018-09-17 DIAGNOSIS — Z8739 Personal history of other diseases of the musculoskeletal system and connective tissue: Secondary | ICD-10-CM

## 2018-09-17 DIAGNOSIS — M7712 Lateral epicondylitis, left elbow: Secondary | ICD-10-CM

## 2018-09-17 DIAGNOSIS — M19041 Primary osteoarthritis, right hand: Secondary | ICD-10-CM

## 2018-09-17 DIAGNOSIS — M25562 Pain in left knee: Secondary | ICD-10-CM

## 2018-09-17 DIAGNOSIS — R768 Other specified abnormal immunological findings in serum: Secondary | ICD-10-CM

## 2018-09-17 DIAGNOSIS — M47816 Spondylosis without myelopathy or radiculopathy, lumbar region: Secondary | ICD-10-CM

## 2018-09-17 DIAGNOSIS — M19042 Primary osteoarthritis, left hand: Secondary | ICD-10-CM

## 2018-09-17 DIAGNOSIS — Z9889 Other specified postprocedural states: Secondary | ICD-10-CM

## 2018-09-17 DIAGNOSIS — M19072 Primary osteoarthritis, left ankle and foot: Secondary | ICD-10-CM

## 2018-09-17 NOTE — Telephone Encounter (Signed)
Last visit: 03/18/18 Next visit: 09/17/18  Okay to refill per Dr. Estanislado Pandy

## 2018-09-17 NOTE — Patient Instructions (Signed)

## 2018-12-17 ENCOUNTER — Other Ambulatory Visit: Payer: Self-pay | Admitting: Rheumatology

## 2018-12-17 NOTE — Telephone Encounter (Signed)
Last Visit: 09/17/18 Next Visit: 03/18/19  Okay to refill per Dr. Estanislado Pandy

## 2019-03-15 ENCOUNTER — Other Ambulatory Visit: Payer: Self-pay | Admitting: Rheumatology

## 2019-03-15 NOTE — Telephone Encounter (Signed)
Last Visit: 09/17/2018  Next Visit: 03/18/2019  Okay to refill per Dr. Estanislado Pandy.

## 2019-03-18 ENCOUNTER — Ambulatory Visit: Payer: BLUE CROSS/BLUE SHIELD | Admitting: Rheumatology

## 2019-05-04 ENCOUNTER — Ambulatory Visit: Payer: BLUE CROSS/BLUE SHIELD | Admitting: Rheumatology

## 2019-06-03 NOTE — Progress Notes (Signed)
Office Visit Note  Patient: Hailey Duncan             Date of Birth: 29-Jul-1970           MRN: [id number]             PCP: Molli Posey, MD Referring: Molli Posey, MD Visit Date: 06/10/2019 Occupation: @GUAROCC @  Subjective:  Sicca symptoms   History of Present Illness: Hailey Duncan is a 49 y.o. female with history of sicca syndrome, osteoarthritis, and DDD.  Patient reports that her sicca symptoms have been tolerable.  She followed up with her ophthalmologist recently and according to the patient her eye dryness has been stable.  She continues to take pilocarpine as needed for symptomatic relief.  She continues to take fish oil in the morning and flaxseeds at night.  She remains very active exercising on a daily basis.  She walks 3 miles every day.  She experiences some discomfort in her shoulders if she performs over her use activities.  She states that her neck pain and stiffness has been improving.  Her lower back pain is tolerable.  She denies any joint swelling at this time.  She has less discomfort and stiffness in her hands.  She is not had any difficulty with ADLs.    Activities of Daily Living:  Patient reports morning stiffness for 10-15 minutes.   Patient Reports nocturnal pain.  Difficulty dressing/grooming: Denies Difficulty climbing stairs: Denies Difficulty getting out of chair: Denies Difficulty using hands for taps, buttons, cutlery, and/or writing: Denies  Review of Systems  Constitutional: Negative for fatigue.  HENT: Negative for mouth sores, mouth dryness and nose dryness.   Eyes: Negative for pain, itching, visual disturbance and dryness.  Respiratory: Negative for cough, hemoptysis, shortness of breath and difficulty breathing.   Cardiovascular: Negative for chest pain, palpitations, hypertension and swelling in legs/feet.  Gastrointestinal: Negative for blood in stool, constipation and diarrhea.  Endocrine: Negative for increased urination.   Genitourinary: Negative for difficulty urinating and painful urination.  Musculoskeletal: Positive for arthralgias, joint pain and morning stiffness. Negative for joint swelling, myalgias, muscle weakness, muscle tenderness and myalgias.  Skin: Negative for color change, pallor, rash, hair loss, nodules/bumps, skin tightness, ulcers and sensitivity to sunlight.  Allergic/Immunologic: Negative for susceptible to infections.  Neurological: Negative for dizziness, numbness, headaches, memory loss and weakness.  Hematological: Negative for swollen glands.  Psychiatric/Behavioral: Negative for depressed mood, confusion and sleep disturbance. The patient is not nervous/anxious.     PMFS History:  Patient Active Problem List   Diagnosis Date Noted  . Sicca syndrome, unspecified ([place]) 03/14/2016  . Primary osteoarthritis of both feet 03/14/2016  . Primary osteoarthritis of both hands 03/14/2016  . Spondylosis of lumbar region without myelopathy or radiculopathy 03/14/2016    Past Medical History:  Diagnosis Date  . Cervical arthritis   . Osteoarthritis   . Sicca syndrome, unspecified ([place])     Family History  Problem Relation Age of Onset  . Macular degeneration Mother   . Seizures Mother        under control   . Hypertension Mother   . Hypertension Father   . Hypercholesterolemia Father   . Tremor Father   . Hypercholesterolemia Sister    Past Surgical History:  Procedure Laterality Date  . BUNIONECTOMY     Social History   Social History Narrative  . Not on file    There is no immunization history on file for this patient.  Objective: Vital Signs: BP 116/73 (BP Location: Left Arm, Patient Position: Sitting, Cuff Size: Normal)   Pulse 75   Resp 12   Ht 5\' 5"  (1.651 m)   Wt 118 lb (53.5 kg)   BMI 19.64 kg/m    Physical Exam Vitals and nursing note reviewed.  Constitutional:      Appearance: She is well-developed.  HENT:     Head: Normocephalic and atraumatic.    Eyes:     Conjunctiva/sclera: Conjunctivae normal.  Cardiovascular:     Rate and Rhythm: Normal rate and regular rhythm.     Heart sounds: Normal heart sounds.  Pulmonary:     Effort: Pulmonary effort is normal.     Breath sounds: Normal breath sounds.  Abdominal:     General: Bowel sounds are normal.     Palpations: Abdomen is soft.  Musculoskeletal:     Cervical back: Normal range of motion.  Lymphadenopathy:     Cervical: No cervical adenopathy.  Skin:    General: Skin is warm and dry.     Capillary Refill: Capillary refill takes less than 2 seconds.  Neurological:     Mental Status: She is alert and oriented to person, place, and time.  Psychiatric:        Behavior: Behavior normal.      Musculoskeletal Exam: C-spine, thoracic spine, muscle good range of motion.  No midline spinal tenderness.  Shoulder risk of elbow joints, strength of MCPs and PIPs and DIPs good range of motion no synovitis.  She has complete fist formation bilaterally.  Hip joints have good range of motion with no discomfort.  No tenderness over trochanteric bursa bilaterally.  Knee joints have good range of motion with no warmth or effusion.  Ankle joints have good range of motion no tenderness or inflammation.  No tenderness of MTP joints.  No Achilles tendinitis or plantar fasciitis.  CDAI Exam: CDAI Score: -- Patient Global: --; Provider Global: -- Swollen: --; Tender: -- Joint Exam 06/10/2019   No joint exam has been documented for this visit   There is currently no information documented on the homunculus. Go to the Rheumatology activity and complete the homunculus joint exam.  Investigation: No additional findings.  Imaging: No results found.  Recent Labs: Lab Results  Component Value Date   WBC 3.9 03/18/2018   HGB 14.8 03/18/2018   PLT 230 03/18/2018   NA 136 03/18/2018   K 4.0 03/18/2018   CL 99 03/18/2018   CO2 28 03/18/2018   GLUCOSE 74 03/18/2018   BUN 13 03/18/2018    CREATININE 0.73 03/18/2018   BILITOT 0.7 03/18/2018   AST 19 03/18/2018   ALT 17 03/18/2018   PROT 7.8 03/18/2018   CALCIUM 10.1 03/18/2018   GFRAA 114 03/18/2018    Speciality Comments: No specialty comments available.  Procedures:  No procedures performed Allergies: Clindamycin/lincomycin and Penicillins   Assessment / Plan:     Visit Diagnoses: Sicca syndrome ([place]) - Ro- and La- on 03/18/18: She continues to have chronic sicca symptoms which have been tolerable.  She takes pilocarpine 5 mg 3 times daily as needed for symptomatic relief.  She continues to see her ophthalmologist on a regular basis.  We will check the following autoimmune labs today.  She was advised to notify us if she develops any new or worsening symptoms.- Plan: Anti-DNA antibody, double-stranded, C3 and C4, Sedimentation rate, Urinalysis, Routine w reflex microscopic, CBC with Differential/Platelet, ANA, Rheumatoid factor, Sjogrens syndrome-B extractable nuclear antibody,  Sjogrens syndrome-A extractable nuclear antibody  ANA positive - ANA negative on 03/18/2018.  ENA negative, CCP negative, RF negative, sed rate within normal limits on 03/18/2017: She continues to have chronic sicca symptoms.  She has no other clinical features of autoimmune disease at this time.  We will check the following autoimmune lab work today.  She is advised to notify us if she develops any new or worsening symptoms.- Plan: Anti-DNA antibody, double-stranded, C3 and C4, Sedimentation rate, Urinalysis, Routine w reflex microscopic, CBC with Differential/Platelet, ANA, Rheumatoid factor, Sjogrens syndrome-B extractable nuclear antibody, Sjogrens syndrome-A extractable nuclear antibody  Primary osteoarthritis of both hands: She has mild PIP and DIP thickening consistent with osteoarthritis of both hands.  No tenderness or synovitis was noted.  She has complete fist formation bilaterally.  She has noticed increased grip strength.  She has no  difficulty with ADLs. Joint protection and muscle strengthening were discussed.  Primary osteoarthritis of both feet: She is not having any discomfort in her feet at this time.  Ankle joints have good range of motion no tenderness or synovitis.  No tenderness of MTP or PIP joints.  She wears proper fitting shoes and walks 3 miles daily.  Lateral epicondylitis, left elbow: She has intermittent discomfort.  She has voltaren gel which she can use topically as needed. She has no tenderness on exam today.  DDD (degenerative disc disease), cervical: She has good ROM with no discomfort at this time.  No symptoms of radiculopathy.  She performs neck exercises on a regular basis.   Spondylosis of lumbar region without myelopathy or radiculopathy: She is not having any lower back pain at this time.  No symptoms of radiculopathy.   History of TMJ disorder: Resolved   History of bunionectomy  Lipid screening - She requested to have her lipid panel checked today. Plan: Lipid panel  Orders: Orders Placed This Encounter  Procedures  . Lipid panel  . Anti-DNA antibody, double-stranded  . C3 and C4  . Sedimentation rate  . Urinalysis, Routine w reflex microscopic  . CBC with Differential/Platelet  . ANA  . Rheumatoid factor  . Sjogrens syndrome-B extractable nuclear antibody  . Sjogrens syndrome-A extractable nuclear antibody   No orders of the defined types were placed in this encounter.    Follow-Up Instructions: Return in about 6 months (around 12/11/2019) for Sicca syndrome.   Ofilia Neas, PA-C  Note - This record has been created using Dragon software.  Chart creation errors have been sought, but may not always  have been located. Such creation errors do not reflect on  the standard of medical care.

## 2019-06-09 ENCOUNTER — Telehealth: Payer: Self-pay | Admitting: Rheumatology

## 2019-06-09 NOTE — Telephone Encounter (Signed)
Attempted to contact the patient and left message to advise patient we will be able to draw lipid panel at her appointment on 06/09/19. Order pended on upcoming appointment.

## 2019-06-09 NOTE — Telephone Encounter (Signed)
Ok to place order for lipid panel.

## 2019-06-09 NOTE — Telephone Encounter (Signed)
Patient would like to have a Lipid panel drawn tomorrow if possible. Patient would like a call back today to advise, so she can be fasting tomorrow. Please call to advise.

## 2019-06-10 ENCOUNTER — Ambulatory Visit: Payer: BC Managed Care – PPO | Admitting: Physician Assistant

## 2019-06-10 ENCOUNTER — Encounter: Payer: Self-pay | Admitting: Physician Assistant

## 2019-06-10 ENCOUNTER — Encounter (INDEPENDENT_AMBULATORY_CARE_PROVIDER_SITE_OTHER): Payer: Self-pay

## 2019-06-10 ENCOUNTER — Other Ambulatory Visit: Payer: Self-pay

## 2019-06-10 VITALS — BP 116/73 | HR 75 | Resp 12 | Ht 65.0 in | Wt 118.0 lb

## 2019-06-10 DIAGNOSIS — M5030 Other cervical disc degeneration, unspecified cervical region: Secondary | ICD-10-CM

## 2019-06-10 DIAGNOSIS — Z8739 Personal history of other diseases of the musculoskeletal system and connective tissue: Secondary | ICD-10-CM

## 2019-06-10 DIAGNOSIS — M19072 Primary osteoarthritis, left ankle and foot: Secondary | ICD-10-CM

## 2019-06-10 DIAGNOSIS — Z13220 Encounter for screening for lipoid disorders: Secondary | ICD-10-CM

## 2019-06-10 DIAGNOSIS — Z9889 Other specified postprocedural states: Secondary | ICD-10-CM

## 2019-06-10 DIAGNOSIS — M19041 Primary osteoarthritis, right hand: Secondary | ICD-10-CM

## 2019-06-10 DIAGNOSIS — R768 Other specified abnormal immunological findings in serum: Secondary | ICD-10-CM

## 2019-06-10 DIAGNOSIS — M3500 Sicca syndrome, unspecified: Secondary | ICD-10-CM

## 2019-06-10 DIAGNOSIS — M47816 Spondylosis without myelopathy or radiculopathy, lumbar region: Secondary | ICD-10-CM

## 2019-06-10 DIAGNOSIS — M19042 Primary osteoarthritis, left hand: Secondary | ICD-10-CM

## 2019-06-10 DIAGNOSIS — M7712 Lateral epicondylitis, left elbow: Secondary | ICD-10-CM

## 2019-06-10 DIAGNOSIS — M19071 Primary osteoarthritis, right ankle and foot: Secondary | ICD-10-CM

## 2019-06-13 LAB — CBC WITH DIFFERENTIAL/PLATELET
Absolute Monocytes: 299 cells/uL (ref 200–950)
Basophils Absolute: 22 cells/uL (ref 0–200)
Basophils Relative: 0.5 %
Eosinophils Absolute: 31 cells/uL (ref 15–500)
Eosinophils Relative: 0.7 %
HCT: 39.4 % (ref 35.0–45.0)
Hemoglobin: 13.5 g/dL (ref 11.7–15.5)
Lymphs Abs: 1188 cells/uL (ref 850–3900)
MCH: 31.5 pg (ref 27.0–33.0)
MCHC: 34.3 g/dL (ref 32.0–36.0)
MCV: 92.1 fL (ref 80.0–100.0)
MPV: 10.7 fL (ref 7.5–12.5)
Monocytes Relative: 6.8 %
Neutro Abs: 2860 cells/uL (ref 1500–7800)
Neutrophils Relative %: 65 %
Platelets: 173 10*3/uL (ref 140–400)
RBC: 4.28 10*6/uL (ref 3.80–5.10)
RDW: 12.6 % (ref 11.0–15.0)
Total Lymphocyte: 27 %
WBC: 4.4 10*3/uL (ref 3.8–10.8)

## 2019-06-13 LAB — URINALYSIS, ROUTINE W REFLEX MICROSCOPIC
Bilirubin Urine: NEGATIVE
Glucose, UA: NEGATIVE
Hgb urine dipstick: NEGATIVE
Ketones, ur: NEGATIVE
Leukocytes,Ua: NEGATIVE
Nitrite: NEGATIVE
Protein, ur: NEGATIVE
Specific Gravity, Urine: 1.006 (ref 1.001–1.03)
pH: 7.5 (ref 5.0–8.0)

## 2019-06-13 LAB — SEDIMENTATION RATE: Sed Rate: 2 mm/h (ref 0–20)

## 2019-06-13 LAB — LIPID PANEL
Cholesterol: 235 mg/dL — ABNORMAL HIGH (ref <200)
HDL: 105 mg/dL (ref >=50)
LDL Cholesterol (Calc): 115 mg/dL (calc) — ABNORMAL HIGH
Non-HDL Cholesterol (Calc): 130 mg/dL (calc) — ABNORMAL HIGH (ref <130)
Total CHOL/HDL Ratio: 2.2 (calc) (ref <5.0)
Triglycerides: 61 mg/dL (ref <150)

## 2019-06-13 LAB — ANTI-NUCLEAR AB-TITER (ANA TITER): ANA Titer 1: 1:40 {titer} — ABNORMAL HIGH

## 2019-06-13 LAB — C3 AND C4
C3 Complement: 113 mg/dL (ref 83–193)
C4 Complement: 15 mg/dL (ref 15–57)

## 2019-06-13 LAB — ANTI-DNA ANTIBODY, DOUBLE-STRANDED: ds DNA Ab: 3 IU/mL

## 2019-06-13 LAB — RHEUMATOID FACTOR: Rheumatoid fact SerPl-aCnc: <14 IU/mL (ref <14)

## 2019-06-13 LAB — SJOGRENS SYNDROME-B EXTRACTABLE NUCLEAR ANTIBODY: SSB (La) (ENA) Antibody, IgG: <1 AI

## 2019-06-13 LAB — ANA: Anti Nuclear Antibody (ANA): POSITIVE — AB

## 2019-06-13 LAB — SJOGRENS SYNDROME-A EXTRACTABLE NUCLEAR ANTIBODY: SSA (Ro) (ENA) Antibody, IgG: <1 AI

## 2019-06-14 NOTE — Progress Notes (Signed)
ANA 1:40 NS-low titer.  DsDNA negative.  Ro and La negative.  ESR and complements WNL.  RF negative.  CBC WNL.  Cholesterol is elevated-235 and LDL is 115.  Please notify patient and forward labs to PCP.

## 2019-06-15 ENCOUNTER — Other Ambulatory Visit: Payer: Self-pay | Admitting: Rheumatology

## 2019-06-15 ENCOUNTER — Telehealth: Payer: Self-pay | Admitting: Rheumatology

## 2019-06-15 NOTE — Telephone Encounter (Signed)
Please schedule patient a follow up visit. Patient due September 2021. Thanks!  

## 2019-06-15 NOTE — Telephone Encounter (Signed)
Last Visit: 06/10/19 Next Visit due September 2021. Message sent to the front to schedule patient.   Okay to refill per Dr. Estanislado Pandy

## 2019-06-15 NOTE — Telephone Encounter (Signed)
Patient is requesting a copy of her labwork results from 06/10/19 to be mailed to her home address:  [address] [place], [place]  [address]

## 2019-06-15 NOTE — Telephone Encounter (Signed)
Mailed copy of results to patient's home.

## 2019-09-06 ENCOUNTER — Other Ambulatory Visit: Payer: Self-pay | Admitting: Rheumatology

## 2019-09-06 NOTE — Telephone Encounter (Signed)
Last Visit: 06/10/19 Next Visit 12/08/2019  Okay to refill per Dr. Estanislado Pandy

## 2019-11-24 NOTE — Progress Notes (Signed)
Office Visit Note  Patient: Hailey Duncan             Date of Birth: March 22, 1971           MRN: [id number]             PCP: Molli Posey, MD Referring: Molli Posey, MD Visit Date: 12/08/2019 Occupation: @GUAROCC @  Subjective:  Other (right foot pain- fall approximately 4 weeks ago)   History of Present Illness: Hailey Duncan is a 49 y.o. female with history of sicca symptoms and osteoarthritis.  She has been taking pilocarpine 5 mg half tablet twice a day which is controlling her symptoms quite well.  She tripped in the first week of August and twisted her right ankle.  She states is still not recovered completely.  She is uncomfortable wearing certain shoes.  She is suffering from UTI currently.  She has appointment with her PCP today to start on antibiotics.  She states that her right lateral epicondyle is not as bothersome currently.  The neck and lower back pain is tolerable.  She denies any history of oral ulcers, nasal ulcers, malar rash, photosensitivity, Raynaud's phenomenon.  Activities of Daily Living:  Patient reports morning stiffness for 15  minutes.   Patient Reports nocturnal pain.  Difficulty dressing/grooming: Denies Difficulty climbing stairs: Denies Difficulty getting out of chair: Denies Difficulty using hands for taps, buttons, cutlery, and/or writing: Denies  Review of Systems  Constitutional: Positive for fatigue.  HENT: Negative for mouth sores, mouth dryness and nose dryness.   Eyes: Negative for itching and dryness.  Respiratory: Negative for shortness of breath and difficulty breathing.   Cardiovascular: Negative for chest pain and palpitations.  Gastrointestinal: Positive for abdominal pain. Negative for constipation and diarrhea.  Endocrine: Positive for increased urination.  Genitourinary: Positive for difficulty urinating and nocturia.  Musculoskeletal: Positive for arthralgias, joint pain, myalgias, morning stiffness, muscle  tenderness and myalgias. Negative for joint swelling.  Skin: Negative for color change, rash and redness.  Allergic/Immunologic: Negative for susceptible to infections.  Neurological: Negative for dizziness, numbness, headaches, memory loss and weakness.  Hematological: Negative for bruising/bleeding tendency.  Psychiatric/Behavioral: Negative for confusion.    PMFS History:  Patient Active Problem List   Diagnosis Date Noted  . Sicca syndrome, unspecified ([place]) 03/14/2016  . Primary osteoarthritis of both feet 03/14/2016  . Primary osteoarthritis of both hands 03/14/2016  . Spondylosis of lumbar region without myelopathy or radiculopathy 03/14/2016    Past Medical History:  Diagnosis Date  . Cervical arthritis   . Osteoarthritis   . Sicca syndrome, unspecified ([place])     Family History  Problem Relation Age of Onset  . Macular degeneration Mother   . Seizures Mother        under control   . Hypertension Mother   . Hypertension Father   . Hypercholesterolemia Father   . Tremor Father   . Hypercholesterolemia Sister    Past Surgical History:  Procedure Laterality Date  . BUNIONECTOMY     Social History   Social History Narrative  . Not on file   Immunization History  Administered Date(s) Administered  . PFIZER SARS-COV-2 Vaccination 06/29/2019, 07/21/2019     Objective: Vital Signs: BP 131/81 (BP Location: Right Arm, Patient Position: Sitting, Cuff Size: Normal)   Pulse 70   Resp 13   Ht 5\' 5"  (1.651 m)   Wt 117 lb 3.2 oz (53.2 kg)   BMI 19.50 kg/m    Physical Exam Vitals  and nursing note reviewed.  Constitutional:      Appearance: She is well-developed.  HENT:     Head: Normocephalic and atraumatic.  Eyes:     Conjunctiva/sclera: Conjunctivae normal.  Cardiovascular:     Rate and Rhythm: Normal rate and regular rhythm.     Heart sounds: Normal heart sounds.  Pulmonary:     Effort: Pulmonary effort is normal.     Breath sounds: Normal breath sounds.    Abdominal:     General: Bowel sounds are normal.     Palpations: Abdomen is soft.  Musculoskeletal:     Cervical back: Normal range of motion.  Lymphadenopathy:     Cervical: No cervical adenopathy.  Skin:    General: Skin is warm and dry.     Capillary Refill: Capillary refill takes less than 2 seconds.  Neurological:     Mental Status: She is alert and oriented to person, place, and time.  Psychiatric:        Behavior: Behavior normal.      Musculoskeletal Exam: C-spine thoracic and lumbar spine were in good range of motion.  She has some discomfort range of motion cervical lumbar spine.  Shoulder joints, elbow joints, wrist joints in good range of motion.  She has some DIP thickening but no synovitis.  Hip joints, knee joints, ankles with good range of motion.  She has tenderness on palpation over right lateral malleolus and over the right midfoot.  CDAI Exam: CDAI Score: -- Patient Global: --; Provider Global: -- Swollen: --; Tender: -- Joint Exam 12/08/2019   No joint exam has been documented for this visit   There is currently no information documented on the homunculus. Go to the Rheumatology activity and complete the homunculus joint exam.  Investigation: No additional findings.  Imaging: No results found.  Recent Labs: Lab Results  Component Value Date   WBC 4.4 06/10/2019   HGB 13.5 06/10/2019   PLT 173 06/10/2019   NA 136 03/18/2018   K 4.0 03/18/2018   CL 99 03/18/2018   CO2 28 03/18/2018   GLUCOSE 74 03/18/2018   BUN 13 03/18/2018   CREATININE 0.73 03/18/2018   BILITOT 0.7 03/18/2018   AST 19 03/18/2018   ALT 17 03/18/2018   PROT 7.8 03/18/2018   CALCIUM 10.1 03/18/2018   GFRAA 114 03/18/2018    Speciality Comments: No specialty comments available.  Procedures:  No procedures performed Allergies: Clindamycin/lincomycin and Penicillins   Assessment / Plan:     Visit Diagnoses: Sicca syndrome ([place]) - Ro- and La- on 03/18/18: She takes  pilocarpine 5 mg 3 times daily as needed for symptomatic relief.  Patient is taking 5 mg tablet half tablet twice a day which is sufficient for her.  She is also using over-the-counter products.  ANA positive - ANA negative on 03/18/2018.  ENA negative, CCP negative, RF negative, sed rate within normal limits on 03/18/2017: Her last ANA titer was 1: 40 which was barely positive.  She has no clinical features of autoimmune disease.  I do not think any further work-up is needed.  Primary osteoarthritis of both hands-joint protection was discussed.  Injury of right ankle-she twisted her right ankle about a week ago.  She still has discomfort.  She has tenderness over the right lateral malleolus and over the midfoot.  She does not want to go to the hospital for x-rays as we did not have radiology tech in the office today.  She will come next week for  x-rays.  Primary osteoarthritis of both feet-proper fitting shoes were discussed.  Lateral epicondylitis, left elbow-she still has discomfort off and on.  She had mild tenderness on palpation.  Muscle strengthening exercises were discussed.  DDD (degenerative disc disease), cervical-pain is tolerable currently.  Spondylosis of lumbar region without myelopathy or radiculopathy-pain is tolerable.  History of TMJ disorder - Resolved   History of bunionectomy  She is fully vaccinated for COVID-19.  Use of mask, social distancing and hand hygiene was discussed.  She was also advised to get a booster in 8 months from her last vaccine.  Orders: No orders of the defined types were placed in this encounter.  No orders of the defined types were placed in this encounter.     Follow-Up Instructions: Return in about 6 months (around 06/06/2020) for Sicca, OA.   Bo Merino, MD  Note - This record has been created using Editor, commissioning.  Chart creation errors have been sought, but may not always  have been located. Such creation errors do not  reflect on  the standard of medical care.

## 2019-12-08 ENCOUNTER — Other Ambulatory Visit: Payer: Self-pay

## 2019-12-08 ENCOUNTER — Encounter: Payer: Self-pay | Admitting: Rheumatology

## 2019-12-08 ENCOUNTER — Ambulatory Visit: Payer: BC Managed Care – PPO | Admitting: Rheumatology

## 2019-12-08 VITALS — BP 131/81 | HR 70 | Resp 13 | Ht 65.0 in | Wt 117.2 lb

## 2019-12-08 DIAGNOSIS — M19041 Primary osteoarthritis, right hand: Secondary | ICD-10-CM

## 2019-12-08 DIAGNOSIS — M7712 Lateral epicondylitis, left elbow: Secondary | ICD-10-CM

## 2019-12-08 DIAGNOSIS — M3500 Sicca syndrome, unspecified: Secondary | ICD-10-CM | POA: Diagnosis not present

## 2019-12-08 DIAGNOSIS — R768 Other specified abnormal immunological findings in serum: Secondary | ICD-10-CM

## 2019-12-08 DIAGNOSIS — S99911D Unspecified injury of right ankle, subsequent encounter: Secondary | ICD-10-CM | POA: Diagnosis not present

## 2019-12-08 DIAGNOSIS — M5030 Other cervical disc degeneration, unspecified cervical region: Secondary | ICD-10-CM

## 2019-12-08 DIAGNOSIS — R7689 Other specified abnormal immunological findings in serum: Secondary | ICD-10-CM

## 2019-12-08 DIAGNOSIS — M19042 Primary osteoarthritis, left hand: Secondary | ICD-10-CM

## 2019-12-08 DIAGNOSIS — M19071 Primary osteoarthritis, right ankle and foot: Secondary | ICD-10-CM

## 2019-12-08 DIAGNOSIS — M47816 Spondylosis without myelopathy or radiculopathy, lumbar region: Secondary | ICD-10-CM

## 2019-12-08 DIAGNOSIS — Z9889 Other specified postprocedural states: Secondary | ICD-10-CM

## 2019-12-08 DIAGNOSIS — M19072 Primary osteoarthritis, left ankle and foot: Secondary | ICD-10-CM

## 2019-12-08 DIAGNOSIS — Z8739 Personal history of other diseases of the musculoskeletal system and connective tissue: Secondary | ICD-10-CM

## 2019-12-08 DIAGNOSIS — Z7189 Other specified counseling: Secondary | ICD-10-CM

## 2019-12-13 ENCOUNTER — Telehealth: Payer: Self-pay | Admitting: Rheumatology

## 2019-12-13 NOTE — Telephone Encounter (Signed)
Patient left a voicemail requesting a return call to let her know when she can come in for an x-ray of her right foot and ankle.   LMOM for patient to call and let us know if she will be coming to the office this afternoon or another day this week.

## 2019-12-14 ENCOUNTER — Ambulatory Visit (INDEPENDENT_AMBULATORY_CARE_PROVIDER_SITE_OTHER): Payer: BLUE CROSS/BLUE SHIELD

## 2019-12-14 ENCOUNTER — Ambulatory Visit: Payer: Self-pay

## 2019-12-14 ENCOUNTER — Other Ambulatory Visit: Payer: Self-pay | Admitting: Rheumatology

## 2019-12-14 DIAGNOSIS — M19071 Primary osteoarthritis, right ankle and foot: Secondary | ICD-10-CM

## 2019-12-14 DIAGNOSIS — S99911D Unspecified injury of right ankle, subsequent encounter: Secondary | ICD-10-CM

## 2019-12-14 NOTE — Addendum Note (Signed)
Addended by: Shona Needles on: 12/14/2019 11:18 AM   Modules accepted: Orders

## 2019-12-14 NOTE — Telephone Encounter (Signed)
Last Visit: 12/08/2019 Next Visit: 06/08/2019  Okay to refill per Dr. Estanislado Pandy

## 2019-12-14 NOTE — Telephone Encounter (Signed)
Patient called back stating she is unable to come in the office in the afternoon due to picking up her son at school.  Patient states she will stop by the office at 11:00 today for x-ray.

## 2019-12-15 ENCOUNTER — Other Ambulatory Visit: Payer: Self-pay | Admitting: *Deleted

## 2019-12-15 DIAGNOSIS — M25571 Pain in right ankle and joints of right foot: Secondary | ICD-10-CM
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
## 2020-06-07 ENCOUNTER — Ambulatory Visit: Payer: BLUE CROSS/BLUE SHIELD | Admitting: Rheumatology

## 2020-06-07 ENCOUNTER — Other Ambulatory Visit: Payer: Self-pay

## 2020-06-07 ENCOUNTER — Encounter: Payer: Self-pay | Admitting: Rheumatology

## 2020-06-07 VITALS — BP 131/81 | HR 90 | Resp 13 | Ht 65.0 in | Wt 122.0 lb

## 2020-06-07 DIAGNOSIS — Z8739 Personal history of other diseases of the musculoskeletal system and connective tissue: Secondary | ICD-10-CM

## 2020-06-07 DIAGNOSIS — M5030 Other cervical disc degeneration, unspecified cervical region: Secondary | ICD-10-CM

## 2020-06-07 DIAGNOSIS — M79671 Pain in right foot: Secondary | ICD-10-CM | POA: Diagnosis not present

## 2020-06-07 DIAGNOSIS — M19072 Primary osteoarthritis, left ankle and foot: Secondary | ICD-10-CM

## 2020-06-07 DIAGNOSIS — R768 Other specified abnormal immunological findings in serum: Secondary | ICD-10-CM

## 2020-06-07 DIAGNOSIS — M19041 Primary osteoarthritis, right hand: Secondary | ICD-10-CM | POA: Diagnosis not present

## 2020-06-07 DIAGNOSIS — R7689 Other specified abnormal immunological findings in serum: Secondary | ICD-10-CM

## 2020-06-07 DIAGNOSIS — M19071 Primary osteoarthritis, right ankle and foot: Secondary | ICD-10-CM

## 2020-06-07 DIAGNOSIS — Z9889 Other specified postprocedural states: Secondary | ICD-10-CM

## 2020-06-07 DIAGNOSIS — M7712 Lateral epicondylitis, left elbow: Secondary | ICD-10-CM

## 2020-06-07 DIAGNOSIS — S99911D Unspecified injury of right ankle, subsequent encounter: Secondary | ICD-10-CM

## 2020-06-07 DIAGNOSIS — M19042 Primary osteoarthritis, left hand: Secondary | ICD-10-CM

## 2020-06-07 DIAGNOSIS — S92901A Unspecified fracture of right foot, initial encounter for closed fracture: Secondary | ICD-10-CM

## 2020-06-07 DIAGNOSIS — M47816 Spondylosis without myelopathy or radiculopathy, lumbar region: Secondary | ICD-10-CM

## 2020-06-07 DIAGNOSIS — M3500 Sicca syndrome, unspecified: Secondary | ICD-10-CM

## 2020-06-13 ENCOUNTER — Telehealth: Payer: Self-pay

## 2020-06-13 DIAGNOSIS — M79671 Pain in right foot: Secondary | ICD-10-CM

## 2020-06-13 NOTE — Telephone Encounter (Signed)
Advised patient of MRI results and recommendations. Referral has been placed for [facility].

## 2020-06-13 NOTE — Telephone Encounter (Signed)
MRI results received via fax.   Date of MRI: 06/10/2020  Results: tendinosis of the peroneus brevis with longitudinal split tear just distal to lateral malleolus.   MRI reviewed by Hazel Sams, PA-C and recommended referral to [facility]. Copy of MRI sent to the scan center.   Attempted to contact patient and left message on machine to advise patient to call the office. Will place referral once patient calls back.

## 2020-06-15 ENCOUNTER — Encounter: Payer: Self-pay | Admitting: Sports Medicine

## 2020-06-15 ENCOUNTER — Other Ambulatory Visit: Payer: Self-pay

## 2020-06-15 ENCOUNTER — Ambulatory Visit (INDEPENDENT_AMBULATORY_CARE_PROVIDER_SITE_OTHER): Payer: 59 | Admitting: Sports Medicine

## 2020-06-15 ENCOUNTER — Telehealth: Payer: Self-pay | Admitting: Rheumatology

## 2020-06-15 DIAGNOSIS — M79671 Pain in right foot: Secondary | ICD-10-CM

## 2020-06-15 DIAGNOSIS — S86311A Strain of muscle(s) and tendon(s) of peroneal muscle group at lower leg level, right leg, initial encounter: Secondary | ICD-10-CM

## 2020-06-15 DIAGNOSIS — M792 Neuralgia and neuritis, unspecified: Secondary | ICD-10-CM | POA: Diagnosis not present

## 2020-06-15 DIAGNOSIS — S99921A Unspecified injury of right foot, initial encounter: Secondary | ICD-10-CM

## 2020-06-15 NOTE — Patient Instructions (Signed)
Your MRI suggests a tear:  peroneus brevis with longitudinal split tear just distal to lateral malleolus.   Use the brace to help keep pressure off the ankle tendon any time you are attempting to walk/stand or be active  Continue with rest, topical and oral anti-inflammatory as tolerated  Be sure to contact the imaging center to get them to send a CD via mail to my office of the MRI and x-rays

## 2020-06-15 NOTE — Telephone Encounter (Signed)
Patient at [facility] for her appointment now. They need her MRI report. Please fax to [phone]. ASAP

## 2020-06-15 NOTE — Progress Notes (Signed)
Subjective:  Hailey Duncan is a 50 y.o. female patient who presents to office for evaluation of right foot/ankle pain. Patient complains of continued pain in the foot over the last 8 months reports that she was initially seen by her rheumatologist who ordered x-rays which were nonconclusive and then she was referred to orthopedics was put in a walking boot and also did physical therapy for what was considered a cuboid chip fracture at that time patient reports that she still has pain pains and left is worsening going up the lateral side of the ankle as well up the leg with some numbness. Patient has tried cam boot and physical therapy with no relief in symptoms. Patient denies any other pedal complaints.  Denies repeat injury but states that the initial injury happened in June 2021 when she was walking in a department store and had a sandal on and the strap broke and she twisted her foot and thought it was a sprain at that time but it never got better.  Patient reports that she struggles with pushing a heavy load wearing certain styles of shoes driving due to the pain in her foot and reports now that she has pain that radiates all the way up to her buttock area and has to sit on a cushion or a pillow.  Review of Systems  All other systems reviewed and are negative.    Patient Active Problem List   Diagnosis Date Noted  . Sicca syndrome, unspecified 03/14/2016  . Primary osteoarthritis of both feet 03/14/2016  . Primary osteoarthritis of both hands 03/14/2016  . Spondylosis of lumbar region without myelopathy or radiculopathy 03/14/2016    Current Outpatient Medications on File Prior to Visit  Medication Sig Dispense Refill  . APPLE CIDER VINEGAR PO Take by mouth daily.    Marland Kitchen BIOTIN PO Take by mouth daily.    . Cholecalciferol (VITAMIN D PO) Take by mouth.    . CRANBERRY PO Take by mouth daily.    . diclofenac sodium (VOLTAREN) 1 % GEL 3 grams to 3 large joints up to 3 times daily 3 Tube 3   . Flaxseed, Linseed, (FLAX SEED OIL) 1000 MG CAPS Take 1,000 mg by mouth.    . fluticasone (FLONASE) 50 MCG/ACT nasal spray as needed.  4  . ibuprofen (ADVIL,MOTRIN) 200 MG tablet Take 200 mg by mouth every 6 (six) hours as needed.    . Lactobacillus (PROBIOTIC ACIDOPHILUS PO) Take by mouth daily.    . LO LOESTRIN FE 1 MG-10 MCG / 10 MCG tablet TK 1 T PO D UTD  0  . pilocarpine (SALAGEN) 5 MG tablet TAKE 1 TABLET BY MOUTH UP TO 3 TIMES DAILY 90 tablet 2  . Turmeric 500 MG CAPS Take 2,000 mg by mouth daily.     . vitamin B-12 (CYANOCOBALAMIN) 1000 MCG tablet Take 1,000 mcg by mouth daily.     No current facility-administered medications on file prior to visit.    Allergies  Allergen Reactions  . Clindamycin/Lincomycin Rash  . Penicillins Rash    Objective:  General: Alert and oriented x3 in no acute distress  Dermatology: No open lesions bilateral lower extremities, no webspace macerations, no ecchymosis bilateral, all nails x 10 are well manicured.  Vascular: Dorsalis Pedis and Posterior Tibial pedal pulses palpable, Capillary Fill Time 3 seconds,(+) pedal hair growth bilateral, no edema bilateral lower extremities, Temperature gradient within normal limits.  Neurology: Gross sensation intact via light touch bilateral, subjective numbness and pain  that radiates all the way to her buttocks presumably sciatic nerve on the right.  Musculoskeletal: Mild tenderness with palpation at right lateral foot and ankle along the peroneal brevis course pain is worse distal to the lateral malleolus at the level of the CC joint on the right.  Strength within normal limits however there is pain that is reproduced on plantarflexion and inversion of foot.  Assessment and Plan: Problem List Items Addressed This Visit   None   Visit Diagnoses    Peroneal tendon tear, right, initial encounter    -  Primary   Neuritis       Right foot pain       Injury of right foot, initial encounter       June  2021 presumably a sprain       -Complete examination performed -X-rays and MRIs not available for physical review at this time however I did review her chart note from her rheumatologist that reports that she does have some inflammation of the tendon as well as a longitudinal split tear just distal to the lateral malleolus of the peroneus brevis tendon -Discussed treatment options for longitudinal tear and at this time we will attempt to mobilize patient utilizing a Tri-Lock ankle brace to see if this will help to offload the tendon area since patient has used a cam boot before with no relief -Advised patient to continue with rest ice elevation and anti-inflammatory medicines as tolerated -May also continue with topical anti-inflammatories as well and soaking as needed -Advised patient to get the imaging center at [facility] to stand over the x-rays and MRI for further physical evaluation -Patient to return to office as scheduled virtual visit to discuss MRI review or sooner if condition worsens.  Landis Martins, DPM

## 2020-06-15 NOTE — Telephone Encounter (Signed)
April faxed

## 2020-06-20 ENCOUNTER — Encounter: Payer: Self-pay | Admitting: Sports Medicine

## 2020-06-22 ENCOUNTER — Other Ambulatory Visit: Payer: Self-pay

## 2020-06-22 ENCOUNTER — Encounter: Payer: Self-pay | Admitting: Sports Medicine

## 2020-06-22 ENCOUNTER — Telehealth (INDEPENDENT_AMBULATORY_CARE_PROVIDER_SITE_OTHER): Payer: 59 | Admitting: Sports Medicine

## 2020-06-22 DIAGNOSIS — M792 Neuralgia and neuritis, unspecified: Secondary | ICD-10-CM

## 2020-06-22 DIAGNOSIS — S86311D Strain of muscle(s) and tendon(s) of peroneal muscle group at lower leg level, right leg, subsequent encounter: Secondary | ICD-10-CM

## 2020-06-22 DIAGNOSIS — S99921D Unspecified injury of right foot, subsequent encounter: Secondary | ICD-10-CM

## 2020-06-22 DIAGNOSIS — M79671 Pain in right foot: Secondary | ICD-10-CM

## 2020-06-22 NOTE — Progress Notes (Signed)
Virtual Visit via Telephone Note  I connected with Hailey Duncan on 06/22/20 at  5:00 PM EDT by telephone and verified that I am speaking with the correct person using two identifiers.  Location: Patient: Hailey Duncan (home)  Provider: Landis Martins, DPM ([facility])    I discussed the limitations, risks, security and privacy concerns of performing an evaluation and management service by telephone and the availability of in person appointments. I also discussed with the patient that there may be a patient responsible charge related to this service. The patient expressed understanding and agreed to proceed.   History of Present Illness: 50 y/o f patient met via telephone visit to discuss MRI and Xray results. Patient reports that brace given last week has helped tremendously and also has stopped PT exercises with some improvement. Admits same soft tissue cyst to area unchanged over the last 6 months. Reports still has some pain when takes the brace off at night and when lying in bed. No other pedal concerns noted.   Observations/Objective: Exam unable to be performed due to telephone nature of visit  Assessment and Plan: Problem List Items Addressed This Visit   None   Visit Diagnoses    Peroneal tendon tear, right, subsequent encounter    -  Primary   Neuritis       Right foot pain       Injury of right foot, subsequent encounter         X-rays and MRI results discussed with patient revealing short segment longitudinal split tear just distal to the lateral malleolus of the peroneus brevis tendon as well as a ganglion cyst  Discussed with patient findings as above Advised patient since she is doing well with Tri-Lock brace to continue wearing this and at bedtime we will prescribe for her to come by the office to pick up a night splint to wear when sleeping at night to prevent excessive strain to peroneal tendon course Advised patient to continue with rest  elevation and avoid over stretching and overextending foot may postpone or hold off on physical therapy at this time Advised patient that there is a small percentage of healing conservatively however if symptoms worsen should consider surgery to repair the tear versus injection or aspiration of cyst  Follow Up Instructions: Return to office in 4-5 weeks for follow-up exam   I discussed the assessment and treatment plan with the patient. The patient was provided an opportunity to ask questions and all were answered. The patient agreed with the plan and demonstrated an understanding of the instructions.   The patient was advised to call back or seek an in-person evaluation if the symptoms worsen or if the condition fails to improve as anticipated.  I provided 11 minutes of non-face-to-face time during this encounter.   Landis Martins, DPM

## 2020-07-13 ENCOUNTER — Other Ambulatory Visit: Payer: Self-pay

## 2020-07-13 ENCOUNTER — Encounter: Payer: Self-pay | Admitting: Sports Medicine

## 2020-07-13 ENCOUNTER — Ambulatory Visit: Payer: 59 | Admitting: Sports Medicine

## 2020-07-13 DIAGNOSIS — M792 Neuralgia and neuritis, unspecified: Secondary | ICD-10-CM

## 2020-07-13 DIAGNOSIS — M25571 Pain in right ankle and joints of right foot: Secondary | ICD-10-CM

## 2020-07-13 DIAGNOSIS — S86311D Strain of muscle(s) and tendon(s) of peroneal muscle group at lower leg level, right leg, subsequent encounter: Secondary | ICD-10-CM | POA: Diagnosis not present

## 2020-07-13 DIAGNOSIS — M6740 Ganglion, unspecified site: Secondary | ICD-10-CM | POA: Diagnosis not present

## 2020-07-13 DIAGNOSIS — M79671 Pain in right foot: Secondary | ICD-10-CM | POA: Diagnosis not present

## 2020-07-13 NOTE — Progress Notes (Signed)
Subjective:  Hailey Duncan is a 50 y.o. female patient who returns to office for follow-up evaluation of right foot and ankle pain.  Patient reports that the brace has helped tremendously and the night splint also helps has decreased using it every night and is changed to every other night.  Patient also states that she does have some pulling sensation that radiates up the back of her right leg but overall feels like her foot is doing better has a little bit of pain when attempting to be very active or do vigorous walking or any type of running had 1 incident where she tried to run after her son to give him his wallet and felt a little bit of pain.  No other pedal complaints noted.   Patient Active Problem List   Diagnosis Date Noted  . Sicca syndrome, unspecified 03/14/2016  . Primary osteoarthritis of both feet 03/14/2016  . Primary osteoarthritis of both hands 03/14/2016  . Spondylosis of lumbar region without myelopathy or radiculopathy 03/14/2016    Current Outpatient Medications on File Prior to Visit  Medication Sig Dispense Refill  . APPLE CIDER VINEGAR PO Take by mouth daily.    Marland Kitchen BIOTIN PO Take by mouth daily.    . Cholecalciferol (VITAMIN D PO) Take by mouth.    . CRANBERRY PO Take by mouth daily.    . diclofenac sodium (VOLTAREN) 1 % GEL 3 grams to 3 large joints up to 3 times daily 3 Tube 3  . Flaxseed, Linseed, (FLAX SEED OIL) 1000 MG CAPS Take 1,000 mg by mouth.    . fluticasone (FLONASE) 50 MCG/ACT nasal spray as needed.  4  . ibuprofen (ADVIL,MOTRIN) 200 MG tablet Take 200 mg by mouth every 6 (six) hours as needed.    . Lactobacillus (PROBIOTIC ACIDOPHILUS PO) Take by mouth daily.    . LO LOESTRIN FE 1 MG-10 MCG / 10 MCG tablet TK 1 T PO D UTD  0  . pilocarpine (SALAGEN) 5 MG tablet TAKE 1 TABLET BY MOUTH UP TO 3 TIMES DAILY 90 tablet 2  . Turmeric 500 MG CAPS Take 2,000 mg by mouth daily.     . vitamin B-12 (CYANOCOBALAMIN) 1000 MCG tablet Take 1,000 mcg by mouth  daily.     No current facility-administered medications on file prior to visit.    Allergies  Allergen Reactions  . Clindamycin/Lincomycin Rash  . Penicillins Rash    Objective:  General: Alert and oriented x3 in no acute distress  Dermatology: No open lesions bilateral lower extremities, no webspace macerations, no ecchymosis bilateral, all nails x 10 are well manicured.  Vascular: Dorsalis Pedis and Posterior Tibial pedal pulses palpable, Capillary Fill Time 3 seconds,(+) pedal hair growth bilateral, no edema bilateral lower extremities, Temperature gradient within normal limits.  Neurology: Gross sensation intact via light touch bilateral, subjective numbness on the right.  Musculoskeletal: Mild tenderness with palpation at right lateral foot and ankle along the peroneal brevis course pain is worse distal to the lateral malleolus at the level of the CC joint on the right however this appears to be improving.  Strength within normal limits however there is pain that is reproduced on plantarflexion and inversion of foot that appears to be improving.  Assessment and Plan: Problem List Items Addressed This Visit   None   Visit Diagnoses    Peroneal tendon tear, right, subsequent encounter    -  Primary   Ganglion cyst       Neuritis  Right foot pain       Right ankle pain, unspecified chronicity           -Complete examination performed -Re-discussed MRI results -Discussed continued care for tear and ganglion cyst with chip fracture at the CC joint -Continue with ankle brace since it is helping her symptoms for the next several weeks -Continue with night splint as directed -Advised patient to continue with rest ice elevation and anti-inflammatory medicines as tolerated -Advised patient to try gentle stretching at the level of the hip -Patient to return to office as scheduled or sooner if condition worsens.  Advised patient at next visit we may consider a localized  steroid injection to see if this will offer her some relief as well.  Landis Martins, DPM

## 2020-08-03 ENCOUNTER — Encounter: Payer: Self-pay | Admitting: Sports Medicine

## 2020-08-03 ENCOUNTER — Ambulatory Visit: Payer: 59 | Admitting: Sports Medicine

## 2020-08-03 ENCOUNTER — Other Ambulatory Visit: Payer: Self-pay

## 2020-08-03 DIAGNOSIS — M79604 Pain in right leg: Secondary | ICD-10-CM

## 2020-08-03 DIAGNOSIS — S86311D Strain of muscle(s) and tendon(s) of peroneal muscle group at lower leg level, right leg, subsequent encounter: Secondary | ICD-10-CM

## 2020-08-03 DIAGNOSIS — M779 Enthesopathy, unspecified: Secondary | ICD-10-CM | POA: Diagnosis not present

## 2020-08-03 DIAGNOSIS — M25571 Pain in right ankle and joints of right foot: Secondary | ICD-10-CM

## 2020-08-03 DIAGNOSIS — M792 Neuralgia and neuritis, unspecified: Secondary | ICD-10-CM | POA: Diagnosis not present

## 2020-08-03 DIAGNOSIS — M6740 Ganglion, unspecified site: Secondary | ICD-10-CM

## 2020-08-03 MED ORDER — TRIAMCINOLONE ACETONIDE 10 MG/ML IJ SUSP
10.0000 mg | Freq: Once | INTRAMUSCULAR | Status: AC
  Administered 2020-08-03: 10 mg

## 2020-08-03 NOTE — Progress Notes (Signed)
Subjective: Hailey Duncan is a 50 y.o. female patient who returns to office for follow-up evaluation of right foot and ankle pain.  Patient reports that she thinks she is getting rubbing or a rash from the brace.  Has been self treating with topical antifungal with improvement.  However still has pain at the lateral foot and ankle with throbbing.  No other pedal complaints noted.   Patient Active Problem List   Diagnosis Date Noted  . Sicca syndrome, unspecified 03/14/2016  . Primary osteoarthritis of both feet 03/14/2016  . Primary osteoarthritis of both hands 03/14/2016  . Spondylosis of lumbar region without myelopathy or radiculopathy 03/14/2016    Current Outpatient Medications on File Prior to Visit  Medication Sig Dispense Refill  . APPLE CIDER VINEGAR PO Take by mouth daily.    Marland Kitchen BIOTIN PO Take by mouth daily.    . Cholecalciferol (VITAMIN D PO) Take by mouth.    . CRANBERRY PO Take by mouth daily.    . diclofenac sodium (VOLTAREN) 1 % GEL 3 grams to 3 large joints up to 3 times daily 3 Tube 3  . Flaxseed, Linseed, (FLAX SEED OIL) 1000 MG CAPS Take 1,000 mg by mouth.    . fluticasone (FLONASE) 50 MCG/ACT nasal spray as needed.  4  . ibuprofen (ADVIL,MOTRIN) 200 MG tablet Take 200 mg by mouth every 6 (six) hours as needed.    . Lactobacillus (PROBIOTIC ACIDOPHILUS PO) Take by mouth daily.    . LO LOESTRIN FE 1 MG-10 MCG / 10 MCG tablet TK 1 T PO D UTD  0  . pilocarpine (SALAGEN) 5 MG tablet TAKE 1 TABLET BY MOUTH UP TO 3 TIMES DAILY 90 tablet 2  . Turmeric 500 MG CAPS Take 2,000 mg by mouth daily.     . vitamin B-12 (CYANOCOBALAMIN) 1000 MCG tablet Take 1,000 mcg by mouth daily.     No current facility-administered medications on file prior to visit.    Allergies  Allergen Reactions  . Clindamycin/Lincomycin Rash  . Penicillins Rash    Objective:  General: Alert and oriented x3 in no acute distress  Dermatology: No open lesions bilateral lower extremities, no  webspace macerations, no ecchymosis bilateral, all nails x 10 are well manicured.  Vascular: Dorsalis Pedis and Posterior Tibial pedal pulses palpable, Capillary Fill Time 3 seconds,(+) pedal hair growth bilateral, no edema bilateral lower extremities, Temperature gradient within normal limits.  Neurology: Gross sensation intact via light touch bilateral, subjective numbness on the right.  Musculoskeletal: Mild tenderness with palpation at right lateral foot and ankle along the peroneal brevis course pain is worse distal to the lateral malleolus at the level of the CC joint on the right and today significantly over the sinus tarsi on the right.  Strength within normal limits however there is pain that is reproduced on plantarflexion and inversion of foot that appears to be improving.  Assessment and Plan: Problem List Items Addressed This Visit   None   Visit Diagnoses    Neuritis    -  Primary   Ganglion cyst       Peroneal tendon tear, right, subsequent encounter       Sinus tarsi syndrome of right foot       Relevant Medications   triamcinolone acetonide (KENALOG) 10 MG/ML injection 10 mg (Start on 08/03/2020 10:45 AM)   Capsulitis       Relevant Medications   triamcinolone acetonide (KENALOG) 10 MG/ML injection 10 mg (Start on 08/03/2020  10:45 AM)   Pain of right lower extremity           -Complete examination performed -Discussed continued care for tear and ganglion cyst with chip fracture at the CC joint with likely underlying sinus tarsi syndrome -After oral consent and aseptic prep, injected a mixture containing 1 ml of 2%  plain lidocaine, 1 ml 0.5% plain marcaine, 0.5 ml of kenalog 10 and 0.5 ml of dexamethasone phosphate into right foot at the sinus tarsi without complication. Post-injection care discussed with patient.  -Continue with ankle brace as directed with proper care using topical antifungal and steroid for her skin until the rash has resolved -Continue with night  splint as directed -Advised patient to continue with rest ice elevation and anti-inflammatory medicines as tolerated -Patient to return to office in 1 month or sooner if problems or issues arise.  Landis Martins, DPM

## 2020-08-31 ENCOUNTER — Other Ambulatory Visit: Payer: Self-pay

## 2020-08-31 ENCOUNTER — Encounter: Payer: Self-pay | Admitting: Sports Medicine

## 2020-08-31 ENCOUNTER — Ambulatory Visit: Payer: 59 | Admitting: Sports Medicine

## 2020-08-31 DIAGNOSIS — M779 Enthesopathy, unspecified: Secondary | ICD-10-CM

## 2020-08-31 DIAGNOSIS — M792 Neuralgia and neuritis, unspecified: Secondary | ICD-10-CM

## 2020-08-31 DIAGNOSIS — M1990 Unspecified osteoarthritis, unspecified site: Secondary | ICD-10-CM | POA: Insufficient documentation

## 2020-08-31 DIAGNOSIS — M79604 Pain in right leg: Secondary | ICD-10-CM

## 2020-08-31 DIAGNOSIS — M7710 Lateral epicondylitis, unspecified elbow: Secondary | ICD-10-CM | POA: Insufficient documentation

## 2020-08-31 DIAGNOSIS — M3501 Sicca syndrome with keratoconjunctivitis: Secondary | ICD-10-CM | POA: Insufficient documentation

## 2020-08-31 DIAGNOSIS — S86311D Strain of muscle(s) and tendon(s) of peroneal muscle group at lower leg level, right leg, subsequent encounter: Secondary | ICD-10-CM

## 2020-08-31 DIAGNOSIS — M6740 Ganglion, unspecified site: Secondary | ICD-10-CM

## 2020-08-31 DIAGNOSIS — M25571 Pain in right ankle and joints of right foot: Secondary | ICD-10-CM

## 2020-08-31 DIAGNOSIS — H16229 Keratoconjunctivitis sicca, not specified as Sjogren's, unspecified eye: Secondary | ICD-10-CM | POA: Insufficient documentation

## 2020-08-31 DIAGNOSIS — M8580 Other specified disorders of bone density and structure, unspecified site: Secondary | ICD-10-CM | POA: Insufficient documentation

## 2020-08-31 DIAGNOSIS — R768 Other specified abnormal immunological findings in serum: Secondary | ICD-10-CM | POA: Insufficient documentation

## 2020-08-31 DIAGNOSIS — M79671 Pain in right foot: Secondary | ICD-10-CM

## 2020-08-31 DIAGNOSIS — S99921S Unspecified injury of right foot, sequela: Secondary | ICD-10-CM

## 2020-08-31 NOTE — Progress Notes (Signed)
Subjective: Hailey Duncan is a 50 y.o. female patient who returns to office for follow-up evaluation of right foot and ankle pain.  Patient reports that the injection at first seem to hurt but after a few days as the medicine dissolve the pain slowly started to ease up and state that with some activities that are light she does very well and has been trying to go a little bit without her brace.  Patient reports that she has not tried running at all but desires to know how long it would take for her tendon to heal.  No other pedal complaints noted.   Patient Active Problem List   Diagnosis Date Noted  . Keratoconjunctivitis sicca ([place]) 08/31/2020  . Lateral epicondylitis 08/31/2020  . Osteoarthritis 08/31/2020  . Osteopenia 08/31/2020  . Positive antinuclear antibody 08/31/2020  . Sicca syndrome, unspecified 03/14/2016  . Primary osteoarthritis of both feet 03/14/2016  . Primary osteoarthritis of both hands 03/14/2016  . Spondylosis of lumbar region without myelopathy or radiculopathy 03/14/2016    Current Outpatient Medications on File Prior to Visit  Medication Sig Dispense Refill  . APPLE CIDER VINEGAR PO Take by mouth daily.    Marland Kitchen BIOTIN PO Take by mouth daily.    . Cholecalciferol (VITAMIN D PO) Take by mouth.    . CRANBERRY PO Take by mouth daily.    . diclofenac sodium (VOLTAREN) 1 % GEL 3 grams to 3 large joints up to 3 times daily 3 Tube 3  . Flaxseed, Linseed, (FLAX SEED OIL) 1000 MG CAPS Take 1,000 mg by mouth.    . fluticasone (FLONASE) 50 MCG/ACT nasal spray as needed.  4  . ibuprofen (ADVIL,MOTRIN) 200 MG tablet Take 200 mg by mouth every 6 (six) hours as needed.    . Lactobacillus (PROBIOTIC ACIDOPHILUS PO) Take by mouth daily.    . LO LOESTRIN FE 1 MG-10 MCG / 10 MCG tablet TK 1 T PO D UTD  0  . mupirocin ointment (BACTROBAN) 2 % mupirocin 2 % topical ointment  APPLY TOPICALLY TO THE SKIN TWICE DAILY    . pilocarpine (SALAGEN) 5 MG tablet TAKE 1 TABLET BY MOUTH UP  TO 3 TIMES DAILY 90 tablet 2  . Turmeric 500 MG CAPS Take 2,000 mg by mouth daily.     . vitamin B-12 (CYANOCOBALAMIN) 1000 MCG tablet Take 1,000 mcg by mouth daily.     No current facility-administered medications on file prior to visit.    Allergies  Allergen Reactions  . Clindamycin/Lincomycin Rash  . Penicillins Rash    Objective:  General: Alert and oriented x3 in no acute distress  Dermatology: No open lesions bilateral lower extremities, no webspace macerations, no ecchymosis bilateral, all nails x 10 are well manicured.  Vascular: Dorsalis Pedis and Posterior Tibial pedal pulses palpable, Capillary Fill Time 3 seconds,(+) pedal hair growth bilateral, no edema bilateral lower extremities, Temperature gradient within normal limits.  Neurology: Gross sensation intact via light touch bilateral, subjective numbness on the right with occasional tingling likely secondary neuritis from previous injury.  Musculoskeletal: Mild tenderness with palpation at right lateral foot and ankle along the peroneal brevis course pain is worse distal to the lateral malleolus at the level of the CC joint on the right and significantly decreased pain over the sinus tarsi on the right.  Strength within normal limits however there is pain that is reproduced on plantarflexion and inversion of foot that appears to be improving as previously noted, no frank instability noted  on exam of the right foot and ankle.  Assessment and Plan: Problem List Items Addressed This Visit   None   Visit Diagnoses    Capsulitis    -  Primary   Neuritis       Ganglion cyst       Peroneal tendon tear, right, subsequent encounter       Sinus tarsi syndrome of right foot       Pain of right lower extremity       Right foot pain       Right ankle pain, unspecified chronicity       Injury of right foot, sequela           -Complete examination performed -Re-Discussed continued care for tear and ganglion cyst with chip  fracture at the CC joint with likely underlying sinus tarsi syndrome -Advised patient that healing will be based on clinical symptoms if she is retrogressed seeing then we will consider a reevaluation with a repeat MRI since patient at this time is refusing any type of surgery however if she is clinically doing well then we will assume that things are healing properly and we will continue to progress patient as tolerated -Advised patient that we will continue to monitor since she is noticing improvements with previous injection -May continue to slowly wean from the ankle brace as tolerated however advised patient on uneven surfaces to use a hiking boot or hiking sandal -Continue with night splint as tolerated -May slowly participate in low impact exercise like water aerobics -Advised patient to continue with rest ice elevation and anti-inflammatory medicines as needed -Patient to return to office in 1.5 months or sooner if problems or issues arise.  Advised patient that she may benefit from PRP injections to the area especially along the peroneal tendon course advised patient if she decides she wants to try this to call the office a few days prior so that way we can set her up with an appointment to to have PRP injection around the time of her next follow-up  Landis Martins, DPM

## 2020-10-12 ENCOUNTER — Encounter: Payer: Self-pay | Admitting: Sports Medicine

## 2020-10-12 ENCOUNTER — Other Ambulatory Visit: Payer: Self-pay

## 2020-10-12 ENCOUNTER — Ambulatory Visit: Payer: 59 | Admitting: Sports Medicine

## 2020-10-12 DIAGNOSIS — M5431 Sciatica, right side: Secondary | ICD-10-CM

## 2020-10-12 DIAGNOSIS — M79651 Pain in right thigh: Secondary | ICD-10-CM | POA: Diagnosis not present

## 2020-10-12 DIAGNOSIS — M6740 Ganglion, unspecified site: Secondary | ICD-10-CM

## 2020-10-12 DIAGNOSIS — M79604 Pain in right leg: Secondary | ICD-10-CM

## 2020-10-12 DIAGNOSIS — S86311D Strain of muscle(s) and tendon(s) of peroneal muscle group at lower leg level, right leg, subsequent encounter: Secondary | ICD-10-CM

## 2020-10-12 DIAGNOSIS — T148XXA Other injury of unspecified body region, initial encounter: Secondary | ICD-10-CM

## 2020-10-12 DIAGNOSIS — S99921S Unspecified injury of right foot, sequela: Secondary | ICD-10-CM

## 2020-10-12 DIAGNOSIS — M79671 Pain in right foot: Secondary | ICD-10-CM

## 2020-10-12 DIAGNOSIS — M25571 Pain in right ankle and joints of right foot: Secondary | ICD-10-CM

## 2020-10-12 DIAGNOSIS — M792 Neuralgia and neuritis, unspecified: Secondary | ICD-10-CM

## 2020-10-12 NOTE — Progress Notes (Signed)
Subjective: Hailey Duncan is a 50 y.o. female patient who returns to office for follow-up evaluation of right foot and ankle pain.  Patient reports that her foot pain has stabilized it does not really bother her as much but still has limitations on how much she can stand and walk before she has discomfort and states that when she is on uneven surfaces she uses her Tri-Lock brace as extra protection because she is afraid to reinjure her foot and ankle.  Patient reports a new pain of increased numbness and shooting pain along the posterior thigh and lower leg that is worsened when she is driving with her foot and leg in a flexed position.  No other pedal complaints noted.   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     BIOTIN PO Take by mouth daily.     Cholecalciferol (VITAMIN D PO) Take by mouth.     CRANBERRY PO Take by mouth daily.     diclofenac sodium (VOLTAREN) 1 % GEL 3 grams to 3 large joints up to 3 times daily 3 Tube 3   Flaxseed, Linseed, (FLAX SEED OIL) 1000 MG CAPS Take 1,000 mg by mouth.     fluticasone (FLONASE) 50 MCG/ACT nasal spray as needed.  4   ibuprofen (ADVIL,MOTRIN) 200 MG tablet Take 200 mg by mouth every 6 (six) hours as needed.     Lactobacillus (PROBIOTIC ACIDOPHILUS PO) Take by mouth daily.     LO LOESTRIN FE 1 MG-10 MCG / 10 MCG tablet TK 1 T PO D UTD  0   mupirocin ointment (BACTROBAN) 2 % mupirocin 2 % topical ointment   APPLY TOPICALLY TO THE SKIN TWICE DAILY     nitrofurantoin, macrocrystal-monohydrate, (MACROBID) 100 MG capsule Take 100 mg by mouth every 12 (twelve) hours.     pilocarpine (SALAGEN) 5 MG tablet TAKE 1 TABLET BY MOUTH UP TO 3 TIMES DAILY 90 tablet 2   sulfamethoxazole-trimethoprim (BACTRIM DS) 800-160 MG tablet Take 1 tablet by mouth 2 (two) times daily.     Turmeric 500 MG CAPS Take 2,000 mg by mouth daily.      vitamin B-12 (CYANOCOBALAMIN) 1000 MCG tablet Take 1,000 mcg by mouth daily.     No current facility-administered medications on file prior to visit.    Allergies  Allergen Reactions   Clindamycin/Lincomycin Rash   Penicillins Rash    Objective:  General: Alert and oriented x3 in no acute distress  Dermatology: No open lesions bilateral lower extremities, no webspace macerations, no ecchymosis bilateral, all nails x 10 are well manicured.  Vascular: Dorsalis Pedis and Posterior Tibial pedal pulses palpable, Capillary Fill Time 3 seconds,(+) pedal hair growth bilateral, no edema bilateral lower extremities, Temperature gradient within normal limits.  Neurology: Gross sensation intact via light touch bilateral, subjective numbness on the right with occasional tingling likely secondary neuritis from previous injury over the peroneal nerve course.  Musculoskeletal: Mild tenderness with palpation at right lateral foot and ankle along the peroneal  brevis course pain is worse distal to the lateral malleolus at the level of the CC joint on the right and significantly decreased pain over the sinus tarsi on the right.  Overall pain at the foot appears to be improving however there is increased pain and tightness at the posterior calf and thigh with radiating sharp shooting pain likely consistent with sciatica possibly from compensation.  Strength within normal limits however patient does admit that she is afraid that she may twisted or sprained her foot or ankle again so is mildly guarded on  manual muscle testing of the right foot and ankle.  Assessment and Plan: Problem List Items Addressed This Visit   None Visit Diagnoses     Neuritis    -  Primary   Pain of right thigh       Sciatica of right side       Ganglion cyst       Peroneal tendon tear, right, subsequent encounter       Sinus tarsi syndrome of right foot       Pain of right lower extremity       Right foot pain       Right ankle pain, unspecified chronicity       Injury of right foot, sequela       Avulsion fracture            -Complete examination performed -Re-Discussed continued care for tear and ganglion cyst with chip fracture at the CC joint with likely underlying sinus tarsi syndrome that has stabilized and appears to not be any worse than before with now new pain at the posterior thigh and radiating up the leg concerning for sciatic pain -Advised patient that we will get a second opinion on her prognosis from physical medicine and rehab -Meanwhile recommended hip and thigh stretching and to avoid any strenuous foot or ankle stretching at this time -Advised patient to try topical pain patch at her low back or pain rub at her thigh area as well as may occasionally use heat or ice -Continue with good supportive shoes -May continue with brace for extensive walking or standing is extra protection to foot and ankle to prevent reinjury -Advised patient that there is possible concern that she may have chronic regional pain syndrome however the size of the ongoing pain there are no other indications that this may be a definite diagnosis at this time -Patient to return to office after opinion from physical medicine and rehab.  Advised patient if they do not have any other recommendations for her care she may benefit from PRP injections of which we may still consider in the future for her foot related symptoms.  Landis Martins, DPM

## 2020-10-26 ENCOUNTER — Encounter: Payer: Self-pay | Admitting: Physical Medicine & Rehabilitation

## 2020-11-22 NOTE — Progress Notes (Signed)
Office Visit Note  Patient: Hailey Duncan             Date of Birth: 02-11-1971           MRN: [id number]             PCP: Molli Posey, MD Referring: Molli Posey, MD Visit Date: 12/06/2020 Occupation: _0 @  Subjective:  Right hip pain.   History of Present Illness: Hailey Duncan is a 50 y.o. female history of osteoarthritis and sicca symptoms.  She states in March 2022 she had MRI of her right foot by Dr. Cannon Kettle which showed right peroneal tendon tear.  She was in a brace and also using night splint for a while.  She also had a cortisone injection in June.  She states after the cortisone injection the symptoms improved.  She has been experiencing some numbness in her right foot and also some discomfort in her right hamstring.  She has been referred to Dr.Kerstein ton for evaluation of paresthesias.  Her appointment is pending.  Is been experiencing increased pain and discomfort in her left lateral epicondyle region.  She denies any neck discomfort.  Although she has been having some lower back pain and she is concerned that it is causing right-sided radiculopathy.  Her sicca symptoms are manageable with pilocarpine and omega-3.  She has been having discomfort in the right trochanteric area.  She states the pain is worse at night when she is laying on her side.  Activities of Daily Living:  Patient reports morning stiffness for 20 minutes.   Patient Reports nocturnal pain.  Difficulty dressing/grooming: Denies Difficulty climbing stairs: Denies Difficulty getting out of chair: Denies Difficulty using hands for taps, buttons, cutlery, and/or writing: Denies  Review of Systems  Constitutional:  Negative for fatigue, night sweats, weight gain and weight loss.  HENT:  Negative for mouth sores, trouble swallowing, trouble swallowing, mouth dryness and nose dryness.   Eyes:  Negative for pain, redness, visual disturbance and dryness.  Respiratory:  Negative for cough,  shortness of breath and difficulty breathing.   Cardiovascular:  Negative for chest pain, palpitations, hypertension, irregular heartbeat and swelling in legs/feet.  Gastrointestinal:  Negative for blood in stool, constipation and diarrhea.  Endocrine: Negative for increased urination.  Genitourinary:  Negative for vaginal dryness.  Musculoskeletal:  Positive for joint pain and joint pain. Negative for joint swelling, myalgias, muscle weakness, morning stiffness, muscle tenderness and myalgias.  Skin:  Negative for color change, rash, hair loss, skin tightness, ulcers and sensitivity to sunlight.  Allergic/Immunologic: Negative for susceptible to infections.  Neurological:  Negative for dizziness, memory loss, night sweats and weakness.  Hematological:  Negative for swollen glands.  Psychiatric/Behavioral:  Positive for sleep disturbance. Negative for depressed mood. The patient is not nervous/anxious.    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   Lateral epicondylitis 08/31/2020   Osteoarthritis 08/31/2020   Osteopenia 08/31/2020   Positive antinuclear antibody 08/31/2020   Sicca syndrome, unspecified 03/14/2016   Primary osteoarthritis of both feet 03/14/2016   Primary osteoarthritis of both hands 03/14/2016   Spondylosis of lumbar region without myelopathy or radiculopathy 03/14/2016    Past Medical History:  Diagnosis Date   Cervical arthritis    Osteoarthritis    Sicca syndrome, unspecified     Family History  Problem Relation Age of Onset   Macular degeneration Mother    Seizures Mother  under control    Hypertension Mother    Hypertension Father    Hypercholesterolemia Father    Tremor Father    Hypercholesterolemia Sister    Past Surgical History:  Procedure Laterality Date   BUNIONECTOMY     Social History   Social History Narrative   Not on file   Immunization History   Administered Date(s) Administered   MMR 09/14/2010   PFIZER(Purple Top)SARS-COV-2 Vaccination 06/29/2019, 07/21/2019     Objective: Vital Signs: BP 133/79 (BP Location: Left Arm, Patient Position: Sitting, Cuff Size: Normal)   Pulse 77   Resp 14   Ht _0  (1.651 m)   Wt 116 lb 3.2 oz (52.7 kg)   BMI 19.34 kg/m    Physical Exam Vitals and nursing note reviewed.  Constitutional:      Appearance: She is well-developed.  HENT:     Head: Normocephalic and atraumatic.  Eyes:     Conjunctiva/sclera: Conjunctivae normal.  Cardiovascular:     Rate and Rhythm: Normal rate and regular rhythm.     Heart sounds: Normal heart sounds.  Pulmonary:     Effort: Pulmonary effort is normal.     Breath sounds: Normal breath sounds.  Abdominal:     General: Bowel sounds are normal.     Palpations: Abdomen is soft.  Musculoskeletal:     Cervical back: Normal range of motion.  Lymphadenopathy:     Cervical: No cervical adenopathy.  Skin:    General: Skin is warm and dry.     Capillary Refill: Capillary refill takes less than 2 seconds.  Neurological:     Mental Status: She is alert and oriented to person, place, and time.  Psychiatric:        Behavior: Behavior normal.     Musculoskeletal Exam: C-spine was in good range of motion.  She had good range of motion of her lumbar spine with some discomfort.  Shoulder joints, elbow joints, wrist joints with good range of motion.  She had bilateral PIP and DIP thickening with no synovitis.  Hip joints, knee joints, and ankles with good range of motion.  She had no tenderness or MTPs.  She had tenderness over right trochanteric bursa.  CDAI Exam: CDAI Score: -- Patient Global: --; Provider Global: -- Swollen: --; Tender: -- Joint Exam 12/06/2020   No joint exam has been documented for this visit   There is currently no information documented on the homunculus. Go to the Rheumatology activity and complete the homunculus joint  exam.  Investigation: No additional findings.  Imaging: No results found.  Recent Labs: Lab Results  Component Value Date   WBC 4.4 06/10/2019   HGB 13.5 06/10/2019   PLT 173 06/10/2019   NA 136 03/18/2018   K 4.0 03/18/2018   CL 99 03/18/2018   CO2 28 03/18/2018   GLUCOSE 74 03/18/2018   BUN 13 03/18/2018   CREATININE 0.73 03/18/2018   BILITOT 0.7 03/18/2018   AST 19 03/18/2018   ALT 17 03/18/2018   PROT 7.8 03/18/2018   CALCIUM 10.1 03/18/2018   GFRAA 114 03/18/2018    Speciality Comments: No specialty comments available.  Procedures:  Large Joint Inj: R greater trochanter on 12/06/2020 10:07 AM Indications: pain Details: 27 G 1.5 in needle, lateral approach  Arthrogram: No  Medications: 40 mg triamcinolone acetonide 40 MG/ML; 1.5 mL lidocaine 1 % Aspirate: 0 mL Outcome: tolerated well, no immediate complications Procedure, treatment alternatives, risks and benefits explained, specific risks discussed. Consent was  given by the patient. Immediately prior to procedure a time out was called to verify the correct patient, procedure, equipment, support staff and site/side marked as required. Patient was prepped and draped in the usual sterile fashion.    Allergies: Clindamycin/lincomycin and Penicillins   Assessment / Plan:     Visit Diagnoses: Sicca syndrome ([place]) - Ro- and La- on 03/18/18: Her sicca symptoms are very well controlled with pilocarpine and omega-3.  Over-the-counter products were discussed.  ANA positive - ANA negative on 03/18/2018.  ENA negative, CCP negative, RF negative, sed rate within normal limits on 03/18/2017: Her last ANA titer was 1: 40.    Pain in right foot - patient was diagnosed with cuboid chip fracture by Dr. French Ana.  She also developed peroneal tendon rupture and was treated by Dr. Cannon Kettle.  Her foot discomfort has improved.  Primary osteoarthritis of both hands-joint protection muscle strengthening was discussed.  Trochanteric  bursitis, right hip-she has been having right trochanteric bursitis and discomfort at night.  IT band stretches were discussed.  A handout was given.  I also injected her right trochanteric bursa with cortisone as her symptoms have been going on for several months now.  The procedure was described above.  Postprocedure instructions were given.  Primary osteoarthritis of both feet - She had right first MTP surgery in the past.  History of bunionectomy  Lateral epicondylitis, left elbow-he had recent flare is that last Sunday started again and she was in the process of moving.  Use of Voltaren gel was discussed.  DDD (degenerative disc disease), cervical-she had good range of motion without discomfort.  Spondylosis of lumbar region without myelopathy or radiculopathy-she continues to have some lower back..  Lumbar spine exercises were demonstrated in the office and a handout was given.  She also has an appointment coming up with the pain management for lower back pain and possible right-sided radiculopathy.  History of TMJ disorder - Resolved   Orders: Orders Placed This Encounter  Procedures   Large Joint Inj    No orders of the defined types were placed in this encounter.    Follow-Up Instructions: Return in about 6 months (around 06/05/2021) for sicca, OA.   Bo Merino, MD  Note - This record has been created using Editor, commissioning.  Chart creation errors have been sought, but may not always  have been located. Such creation errors do not reflect on  the standard of medical care.

## 2020-12-06 ENCOUNTER — Other Ambulatory Visit: Payer: Self-pay

## 2020-12-06 ENCOUNTER — Encounter: Payer: Self-pay | Admitting: Rheumatology

## 2020-12-06 ENCOUNTER — Ambulatory Visit: Payer: 59 | Admitting: Rheumatology

## 2020-12-06 VITALS — BP 133/79 | HR 77 | Resp 14 | Ht 65.0 in | Wt 116.2 lb

## 2020-12-06 DIAGNOSIS — M19071 Primary osteoarthritis, right ankle and foot: Secondary | ICD-10-CM

## 2020-12-06 DIAGNOSIS — M79671 Pain in right foot: Secondary | ICD-10-CM

## 2020-12-06 DIAGNOSIS — M3500 Sicca syndrome, unspecified: Secondary | ICD-10-CM | POA: Diagnosis not present

## 2020-12-06 DIAGNOSIS — M7061 Trochanteric bursitis, right hip: Secondary | ICD-10-CM | POA: Diagnosis not present

## 2020-12-06 DIAGNOSIS — M19042 Primary osteoarthritis, left hand: Secondary | ICD-10-CM

## 2020-12-06 DIAGNOSIS — M5030 Other cervical disc degeneration, unspecified cervical region: Secondary | ICD-10-CM

## 2020-12-06 DIAGNOSIS — Z8739 Personal history of other diseases of the musculoskeletal system and connective tissue: Secondary | ICD-10-CM

## 2020-12-06 DIAGNOSIS — M19072 Primary osteoarthritis, left ankle and foot: Secondary | ICD-10-CM

## 2020-12-06 DIAGNOSIS — Z9889 Other specified postprocedural states: Secondary | ICD-10-CM

## 2020-12-06 DIAGNOSIS — M19041 Primary osteoarthritis, right hand: Secondary | ICD-10-CM | POA: Diagnosis not present

## 2020-12-06 DIAGNOSIS — R768 Other specified abnormal immunological findings in serum: Secondary | ICD-10-CM

## 2020-12-06 DIAGNOSIS — M47816 Spondylosis without myelopathy or radiculopathy, lumbar region: Secondary | ICD-10-CM

## 2020-12-06 DIAGNOSIS — M7712 Lateral epicondylitis, left elbow: Secondary | ICD-10-CM

## 2020-12-06 MED ORDER — TRIAMCINOLONE ACETONIDE 40 MG/ML IJ SUSP
40.0000 mg | INTRAMUSCULAR | Status: AC | PRN
  Administered 2020-12-06: 40 mg via INTRA_ARTICULAR

## 2020-12-06 MED ORDER — LIDOCAINE HCL 1 % IJ SOLN
1.5000 mL | INTRAMUSCULAR | Status: AC | PRN
  Administered 2020-12-06: 1.5 mL

## 2020-12-06 NOTE — Patient Instructions (Signed)
Iliotibial Band Syndrome Rehab Ask your health care provider which exercises are safe for you. Do exercises exactly as told by your health care provider and adjust them as directed. It is normal to feel mild stretching, pulling, tightness, or discomfort as you do these exercises. Stop right away if you feel sudden pain or your pain gets significantly worse. Do not begin these exercises until told by your health care provider. Stretching and range-of-motion exercises These exercises warm up your muscles and joints and improve the movement and flexibility of your hip and pelvis. Quadriceps stretch, prone  Lie on your abdomen (prone position) on a firm surface, such as a bed or padded floor. Bend your left / right knee and reach back to hold your ankle or pant leg. If you cannot reach your ankle or pant leg, loop a belt around your foot and grab the belt instead. Gently pull your heel toward your buttocks. Your knee should not slide out to the side. You should feel a stretch in the front of your thigh and knee (quadriceps). Hold this position for __________ seconds. Repeat __________ times. Complete this exercise __________ times a day. Iliotibial band stretch An iliotibial band is a strong band of muscle tissue that runs from the outer side of your hip to the outer side of your thigh and knee. Lie on your side with your left / right leg in the top position. Bend both of your knees and grab your left / right ankle. Stretch out your bottom arm to help you balance. Slowly bring your top knee back so your thigh goes behind your trunk. Slowly lower your top leg toward the floor until you feel a gentle stretch on the outside of your left / right hip and thigh. If you do not feel a stretch and your knee will not fall farther, place the heel of your other foot on top of your knee and pull your knee down toward the floor with your foot. Hold this position for __________ seconds. Repeat __________ times.  Complete this exercise __________ times a day. Strengthening exercises These exercises build strength and endurance in your hip and pelvis. Endurance is the ability to use your muscles for a long time, even after they get tired. Straight leg raises, side-lying This exercise strengthens the muscles that rotate the leg at the hip and move it away from your body (hip abductors). Lie on your side with your left / right leg in the top position. Lie so your head, shoulder, hip, and knee line up. You may bend your bottom knee to help you balance. Roll your hips slightly forward so your hips are stacked directly over each other and your left / right knee is facing forward. Tense the muscles in your outer thigh and lift your top leg 4-6 inches (10-15 cm). Hold this position for __________ seconds. Slowly lower your leg to return to the starting position. Let your muscles relax completely before doing another repetition. Repeat __________ times. Complete this exercise __________ times a day. Leg raises, prone This exercise strengthens the muscles that move the hips backward (hip extensors). Lie on your abdomen (prone position) on your bed or a firm surface. You can put a pillow under your hips if that is more comfortable for your lower back. Bend your left / right knee so your foot is straight up in the air. Squeeze your buttocks muscles and lift your left / right thigh off the bed. Do not let your back arch. Tense   your thigh muscle as hard as you can without increasing any knee pain. Hold this position for __________ seconds. Slowly lower your leg to return to the starting position and allow it to relax completely. Repeat __________ times. Complete this exercise __________ times a day. Hip hike Stand sideways on a bottom step. Stand on your left / right leg with your other foot unsupported next to the step. You can hold on to a railing or wall for balance if needed. Keep your knees straight and your  torso square. Then lift your left / right hip up toward the ceiling. Slowly let your left / right hip lower toward the floor, past the starting position. Your foot should get closer to the floor. Do not lean or bend your knees. Repeat __________ times. Complete this exercise __________ times a day. This information is not intended to replace advice given to you by your health care provider. Make sure you discuss any questions you have with your health care provider. Document Revised: 05/26/2019 Document Reviewed: 05/26/2019 Elsevier Patient Education  [place]. Back Exercises The following exercises strengthen the muscles that help to support the trunk (torso) and back. They also help to keep the lower back flexible. Doing these exercises can help to prevent or lessen existing low back pain. If you have back pain or discomfort, try doing these exercises 2-3 times each day or as told by your health care provider. As your pain improves, do them once each day, but increase the number of times that you repeat the steps for each exercise (do more repetitions). To prevent the recurrence of back pain, continue to do these exercises once each day or as told by your health care provider. Do exercises exactly as told by your health care provider and adjust them as directed. It is normal to feel mild stretching, pulling, tightness, or discomfort as you do these exercises, but you should stop right away if you feel sudden pain or your pain gets worse. Exercises Single knee to chest Repeat these steps 3-5 times for each leg: Lie on your back on a firm bed or the floor with your legs extended. Bring one knee to your chest. Your other leg should stay extended and in contact with the floor. Hold your knee in place by grabbing your knee or thigh with both hands and hold. Pull on your knee until you feel a gentle stretch in your lower back or buttocks. Hold the stretch for 10-30 seconds. Slowly release  and straighten your leg. Pelvic tilt Repeat these steps 5-10 times: Lie on your back on a firm bed or the floor with your legs extended. Bend your knees so they are pointing toward the ceiling and your feet are flat on the floor. Tighten your lower abdominal muscles to press your lower back against the floor. This motion will tilt your pelvis so your tailbone points up toward the ceiling instead of pointing to your feet or the floor. With gentle tension and even breathing, hold this position for 5-10 seconds. Cat-cow Repeat these steps until your lower back becomes more flexible: Get into a hands-and-knees position on a firm bed or the floor. Keep your hands under your shoulders, and keep your knees under your hips. You may place padding under your knees for comfort. Let your head hang down toward your chest. Contract your abdominal muscles and point your tailbone toward the floor so your lower back becomes rounded like the back of a cat. Hold this position  for 5 seconds. Slowly lift your head, let your abdominal muscles relax, and point your tailbone up toward the ceiling so your back forms a sagging arch like the back of a cow. Hold this position for 5 seconds.  Press-ups Repeat these steps 5-10 times: Lie on your abdomen (face-down) on a firm bed or the floor. Place your palms near your head, about shoulder-width apart. Keeping your back as relaxed as possible and keeping your hips on the floor, slowly straighten your arms to raise the top half of your body and lift your shoulders. Do not use your back muscles to raise your upper torso. You may adjust the placement of your hands to make yourself more comfortable. Hold this position for 5 seconds while you keep your back relaxed. Slowly return to lying flat on the floor.  Bridges Repeat these steps 10 times: Lie on your back on a firm bed or the floor. Bend your knees so they are pointing toward the ceiling and your feet are flat on the  floor. Your arms should be flat at your sides, next to your body. Tighten your buttocks muscles and lift your buttocks off the floor until your waist is at almost the same height as your knees. You should feel the muscles working in your buttocks and the back of your thighs. If you do not feel these muscles, slide your feet 1-2 inches (2.5-5 cm) farther away from your buttocks. Hold this position for 3-5 seconds. Slowly lower your hips to the starting position, and allow your buttocks muscles to relax completely. If this exercise is too easy, try doing it with your arms crossed over your chest. Abdominal crunches Repeat these steps 5-10 times: Lie on your back on a firm bed or the floor with your legs extended. Bend your knees so they are pointing toward the ceiling and your feet are flat on the floor. Cross your arms over your chest. Tip your chin slightly toward your chest without bending your neck. Tighten your abdominal muscles and slowly raise your torso high enough to lift your shoulder blades a tiny bit off the floor. Avoid raising your torso higher than that because it can put too much stress on your lower back and does not help to strengthen your abdominal muscles. Slowly return to your starting position. Back lifts Repeat these steps 5-10 times: Lie on your abdomen (face-down) with your arms at your sides, and rest your forehead on the floor. Tighten the muscles in your legs and your buttocks. Slowly lift your chest off the floor while you keep your hips pressed to the floor. Keep the back of your head in line with the curve in your back. Your eyes should be looking at the floor. Hold this position for 3-5 seconds. Slowly return to your starting position. Contact a health care provider if: Your back pain or discomfort gets much worse when you do an exercise. Your worsening back pain or discomfort does not lessen within 2 hours after you exercise. If you have any of these problems,  stop doing these exercises right away. Do not do them again unless your health care provider says that you can. Get help right away if: You develop sudden, severe back pain. If this happens, stop doing the exercises right away. Do not do them again unless your health care provider says that you can. This information is not intended to replace advice given to you by your health care provider. Make sure you discuss any questions you  have with your health care provider. Document Revised: 05/31/2020 Document Reviewed: 05/31/2020 Elsevier Patient Education  [place].

## 2020-12-16 ENCOUNTER — Other Ambulatory Visit: Payer: Self-pay | Admitting: Rheumatology

## 2020-12-18 NOTE — Telephone Encounter (Signed)
Next Visit: 06/06/2021  Last Visit: 12/06/2020  Last Fill: 12/14/2019  Dx: Sicca syndrome   Current Dose per office note on 12/06/2020: very well controlled with pilocarpine   Okay to refill Pilocarpine?

## 2021-01-02 ENCOUNTER — Encounter: Payer: 59 | Attending: Physical Medicine & Rehabilitation | Admitting: Physical Medicine & Rehabilitation

## 2021-01-02 ENCOUNTER — Other Ambulatory Visit: Payer: Self-pay

## 2021-01-02 ENCOUNTER — Encounter: Payer: Self-pay | Admitting: Physical Medicine & Rehabilitation

## 2021-01-02 VITALS — BP 128/84 | HR 88 | Temp 99.0°F | Ht 65.0 in | Wt 114.2 lb

## 2021-01-02 DIAGNOSIS — R202 Paresthesia of skin: Secondary | ICD-10-CM | POA: Diagnosis not present

## 2021-01-02 DIAGNOSIS — M6289 Other specified disorders of muscle: Secondary | ICD-10-CM | POA: Diagnosis not present

## 2021-01-02 DIAGNOSIS — M25571 Pain in right ankle and joints of right foot: Secondary | ICD-10-CM | POA: Diagnosis not present

## 2021-01-02 NOTE — Patient Instructions (Signed)
Please add ankle eversion exercise with theraband  You may consider trying a Bosu ball for balance

## 2021-01-02 NOTE — Progress Notes (Signed)
Subjective:    Patient ID: Hailey Duncan, female    DOB: 1971/03/10, 50 y.o.   MRN: [id number]  HPI A 50 year old female referred by podiatry for neuritis, sciatic on the right side right foot and ankle pain. Right Lateral hip and thigh pain have largely resolved.  She was given core stabilization exercises as well as hip abductor and extensor strengthening exercises by her rheumatologist.  Numbness at top of foot after ankle injury summer of 2022, ankle inversion injury , xrays neg Sept 2022 Diagnosed with peroneal tendon tear, calcaneocuboid cyst sinus Tarsi syndrome. The patient states she had some numbness at the top of the right foot after a bunionectomy performed almost 20 years ago but then this seemed to subside after a while. Patient indicates that her tingling is intermittent at the second and third toes of the right foot mainly when driving.  It is not particularly painful.  She states her pain right now is 1 out of 10 may get up to a 3 out of 10.  She has a walking tolerance of greater than 1 hour.  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  She exercises 7 days a week with 45-minute to 60-minute sessions.  She does some light weight training she does her core exercises including hip strengthening exercises. She does use ibuprofen a couple times a week.  In addition she uses Voltaren gel to her hands daily. She no longer has much in terms of back pain.  She never felt like she had pain going from her back to her feet but rather from her feet to her back.  She had no weakness of her extremities or bowel or bladder dysfunction.   Pain Inventory Average Pain 3 Pain Right Now 1 My pain is intermittent, tingling, and aching  In the last 24 hours, has pain interfered with the following? General activity 3 Relation with others 2 Enjoyment of life 3 What TIME of day is your pain at its worst? evening Sleep  (in general) Poor  Pain is worse with: sitting, standing, and some activites Pain improves with: rest and therapy/exercise Relief from Meds: 1  walk without assistance how many minutes can you walk? 60 Minutes ability to climb steps?  yes do you drive?  yes  retired  numbness tingling  Any changes since last visit?  yes CT/MRI of the right foot  Any changes since last visit?  no, New Patient    Family History  Problem Relation Age of Onset   Macular degeneration Mother    Seizures Mother        under control    Hypertension Mother    Hypertension Father    Hypercholesterolemia Father    Tremor Father    Hypercholesterolemia Sister    Social History   Socioeconomic History   Marital status: Married    Spouse name: Not on file   Number of children: Not on file   Years of education: Not on file   Highest education level: Not on file  Occupational History   Not on file  Tobacco Use   Smoking status: Never   Smokeless tobacco: Never  Vaping Use   Vaping Use: Never used  Substance and Sexual Activity   Alcohol use: Yes    Comment: occ   Drug use: No   Sexual activity: Yes  Other Topics Concern   Not on file  Social History Narrative   Not on file   Social Determinants of Health   Financial Resource Strain: Not on file  Food Insecurity: Not on file  Transportation Needs: Not on file  Physical Activity: Not on file  Stress: Not on file  Social Connections: Not on file   Past Surgical History:  Procedure Laterality Date   BUNIONECTOMY     Past Medical History:  Diagnosis Date   Cervical arthritis    Osteoarthritis    Sicca syndrome, unspecified    BP 128/84   Pulse 88   Temp 99 F (37.2 C)   Ht 5\' 5"  (1.651 m)   Wt 114 lb 3.2 oz (51.8 kg)   SpO2 100%   BMI 19.00 kg/m   Opioid Risk Score:   Fall Risk Score:  `1  Depression screen PHQ 2/9  No flowsheet data found.  Review of Systems  Musculoskeletal:        Right leg form hip down  to foot ( numbness & tingling)  Right ankle pain  All other systems reviewed and are negative.     Objective:   Physical Exam Vitals and nursing note reviewed.  Constitutional:      Appearance: She is normal weight.  HENT:     Head: Normocephalic and atraumatic.  Eyes:     Extraocular Movements: Extraocular movements intact.     Conjunctiva/sclera: Conjunctivae normal.     Pupils: Pupils are equal, round, and reactive to light.  Cardiovascular:     Rate and Rhythm: Normal rate and regular rhythm.     Heart sounds: Normal heart sounds.  Pulmonary:     Effort: Pulmonary effort is normal. No respiratory distress.     Breath sounds: Normal breath sounds.  Abdominal:     General: Abdomen is flat. Bowel sounds are normal. There is no distension.     Palpations: Abdomen is soft.  Musculoskeletal:        General: No tenderness.     Cervical back: Normal range of motion.     Right lower leg: No edema.     Left lower leg: No edema.     Comments: No tenderness in the lumbar area Mild tenderness over the right greater trochanter of the hip Negative distraction test Negative Faber's Negative straight leg raising test  Skin:    General: Skin is warm and dry.  Neurological:     Mental Status: She is alert and oriented to person, place, and time.     Deep Tendon Reflexes:     Reflex Scores:      Patellar reflexes are 2+ on the right side and 2+ on the left side.      Achilles reflexes are 2+ on the right side and 1+ on the left side.    Comments: Hypersensitivity to touch over the feet.  No erythema normal skin color no evidence of hyperhidrosis or hyperhidrosis, no nailbed changes Sensation intact to pinprick bilateral C5 C6-C7-C8 as well as L for L5 dermatomal distribution bilaterally reduced S1 bilaterally  Motor strength is 5/5 bilateral deltoid, bicep, tricep, grip, hip flexor, knee extensor, ankle dorsiflexor and plantar flexor  4+ bilateral hip extensors 4+ bilateral hip  abductors. 4 bilateral foot inverters   Psychiatric:        Mood and Affect: Mood normal.        Behavior: Behavior normal.        Thought Content: Thought content normal.        Judgment: Judgment  normal.          Assessment & Plan:   #1.  Right trochanteric bursitis/tensor fascia lata syndrome improved after corticosteroid injection plus stretching and strengthening exercises. Discussed continue core strengthening as well as pelvic strengthening exercises on an ongoing basis to prevent recurrence 2.  Sinus Tarsi syndrome partial tear peroneal brevis tendon on MRI in March.  Has foot evertor weakness certainly putting her at some risk for recurrent ankle inversion injury, given timeframe I think it would be okay to start foot eversion exercises with Thera-Band. 3.  Paresthesias at second and third toes she feels like they go numb but do not have any loss of pinprick on examination she does have reduced pinprick sensation bilateral S1 dermatome distribution but on the left Achilles DTR's mildly reduced.  At this point it does not cause any discomfort on an ongoing basis.  I do not think she needs any nerve pain medications for this.  I do not see any signs of CRPS Patient no longer has back pain, MRI of the lumbar spine would likely be of low yield at this time EMG/NCV would be of low yield. If she has any worsening of her symptoms she can call for follow-up appointment.

## 2021-05-23 NOTE — Progress Notes (Signed)
? ?Office Visit Note ? ?Patient: Hailey Duncan             ?Date of Birth: 08/31/1970           ?MRN: [id number]             ?PCP: Hammer, Eli, MD ?Referring: Holland, Richard, MD ?Visit Date: 06/06/2021 ?Occupation: @GUAROCC@ ? ?Subjective:  ?Dry mouth and dry eyes ? ?History of Present Illness: Hailey Duncan is a 50 y.o. female with history of sicca symptoms and osteoarthritis.  She continues to have dry mouth and dry eyes.  She states her symptoms are very well controlled with pilocarpine 2.5 mg 2-3 times a day.  She states she developed strep throat in February which was treated with Z-Pak.  After that she developed a sinus infection.  Since then she has been having increased tearing in her eyes.  She states her dry eye symptoms are well controlled with pilocarpine.  She denies any history of oral ulcers, nasal ulcers, malar rash, photosensitivity, Raynaud's phenomenon or lymphadenopathy.  She states her hands and stay cool during the colder weather. ? ?Activities of Daily Living:  ?Patient reports morning stiffness for 15-20  minutes.   ?Patient Reports nocturnal pain.  ?Difficulty dressing/grooming: Denies ?Difficulty climbing stairs: Denies ?Difficulty getting out of chair: Denies ?Difficulty using hands for taps, buttons, cutlery, and/or writing: Denies ? ?Review of Systems  ?Constitutional:  Positive for fatigue.  ?HENT:  Negative for mouth sores, mouth dryness and nose dryness.   ?Eyes:  Negative for pain, itching, visual disturbance and dryness.  ?Respiratory:  Negative for shortness of breath and difficulty breathing.   ?Cardiovascular:  Negative for chest pain and palpitations.  ?Gastrointestinal:  Negative for blood in stool, constipation and diarrhea.  ?Endocrine: Negative for increased urination.  ?Genitourinary:  Negative for difficulty urinating.  ?Musculoskeletal:  Positive for myalgias, morning stiffness and myalgias. Negative for joint pain, joint pain, joint swelling and muscle  tenderness.  ?Skin:  Negative for color change, rash and redness.  ?Allergic/Immunologic: Negative for susceptible to infections.  ?Neurological:  Positive for numbness. Negative for dizziness, headaches, memory loss and weakness.  ?Hematological:  Negative for bruising/bleeding tendency.  ?Psychiatric/Behavioral:  Negative for confusion.   ? ?PMFS History:  ?Patient Active Problem List  ? Diagnosis Date Noted  ? Keratoconjunctivitis sicca (HCC) 08/31/2020  ? Lateral epicondylitis 08/31/2020  ? Osteoarthritis 08/31/2020  ? Osteopenia 08/31/2020  ? Positive antinuclear antibody 08/31/2020  ? Sicca syndrome, unspecified 03/14/2016  ? Primary osteoarthritis of both feet 03/14/2016  ? Primary osteoarthritis of both hands 03/14/2016  ? Spondylosis of lumbar region without myelopathy or radiculopathy 03/14/2016  ?  ?Past Medical History:  ?Diagnosis Date  ? Cervical arthritis   ? Osteoarthritis   ? Sicca syndrome, unspecified   ?  ?Family History  ?Problem Relation Age of Onset  ? Macular degeneration Mother   ? Seizures Mother   ?     under control   ? Hypertension Mother   ? Hypertension Father   ? Hypercholesterolemia Father   ? Tremor Father   ? Hypercholesterolemia Sister   ? ?Past Surgical History:  ?Procedure Laterality Date  ? BUNIONECTOMY    ? ?Social History  ? ?Social History Narrative  ? Not on file  ? ?Immunization History  ?Administered Date(s) Administered  ? MMR 09/14/2010  ? PFIZER(Purple Top)SARS-COV-2 Vaccination 06/29/2019, 07/21/2019  ?  ? ?Objective: ?Vital Signs: BP (!) 145/77 (BP Location: Left Arm, Patient Position: Sitting, Cuff Size:   ? ?Office Visit Note ? ?Patient: Hailey Duncan             ?Date of Birth: 08/31/1970           ?MRN: [id number]             ?PCP: Hammer, Eli, MD ?Referring: Holland, Richard, MD ?Visit Date: 06/06/2021 ?Occupation: @GUAROCC@ ? ?Subjective:  ?Dry mouth and dry eyes ? ?History of Present Illness: Hailey Duncan is a 50 y.o. female with history of sicca symptoms and osteoarthritis.  She continues to have dry mouth and dry eyes.  She states her symptoms are very well controlled with pilocarpine 2.5 mg 2-3 times a day.  She states she developed strep throat in February which was treated with Z-Pak.  After that she developed a sinus infection.  Since then she has been having increased tearing in her eyes.  She states her dry eye symptoms are well controlled with pilocarpine.  She denies any history of oral ulcers, nasal ulcers, malar rash, photosensitivity, Raynaud's phenomenon or lymphadenopathy.  She states her hands and stay cool during the colder weather. ? ?Activities of Daily Living:  ?Patient reports morning stiffness for 15-20  minutes.   ?Patient Reports nocturnal pain.  ?Difficulty dressing/grooming: Denies ?Difficulty climbing stairs: Denies ?Difficulty getting out of chair: Denies ?Difficulty using hands for taps, buttons, cutlery, and/or writing: Denies ? ?Review of Systems  ?Constitutional:  Positive for fatigue.  ?HENT:  Negative for mouth sores, mouth dryness and nose dryness.   ?Eyes:  Negative for pain, itching, visual disturbance and dryness.  ?Respiratory:  Negative for shortness of breath and difficulty breathing.   ?Cardiovascular:  Negative for chest pain and palpitations.  ?Gastrointestinal:  Negative for blood in stool, constipation and diarrhea.  ?Endocrine: Negative for increased urination.  ?Genitourinary:  Negative for difficulty urinating.  ?Musculoskeletal:  Positive for myalgias, morning stiffness and myalgias. Negative for joint pain, joint pain, joint swelling and muscle  tenderness.  ?Skin:  Negative for color change, rash and redness.  ?Allergic/Immunologic: Negative for susceptible to infections.  ?Neurological:  Positive for numbness. Negative for dizziness, headaches, memory loss and weakness.  ?Hematological:  Negative for bruising/bleeding tendency.  ?Psychiatric/Behavioral:  Negative for confusion.   ? ?PMFS History:  ?Patient Active Problem List  ? Diagnosis Date Noted  ? Keratoconjunctivitis sicca (HCC) 08/31/2020  ? Lateral epicondylitis 08/31/2020  ? Osteoarthritis 08/31/2020  ? Osteopenia 08/31/2020  ? Positive antinuclear antibody 08/31/2020  ? Sicca syndrome, unspecified 03/14/2016  ? Primary osteoarthritis of both feet 03/14/2016  ? Primary osteoarthritis of both hands 03/14/2016  ? Spondylosis of lumbar region without myelopathy or radiculopathy 03/14/2016  ?  ?Past Medical History:  ?Diagnosis Date  ? Cervical arthritis   ? Osteoarthritis   ? Sicca syndrome, unspecified   ?  ?Family History  ?Problem Relation Age of Onset  ? Macular degeneration Mother   ? Seizures Mother   ?     under control   ? Hypertension Mother   ? Hypertension Father   ? Hypercholesterolemia Father   ? Tremor Father   ? Hypercholesterolemia Sister   ? ?Past Surgical History:  ?Procedure Laterality Date  ? BUNIONECTOMY    ? ?Social History  ? ?Social History Narrative  ? Not on file  ? ?Immunization History  ?Administered Date(s) Administered  ? MMR 09/14/2010  ? PFIZER(Purple Top)SARS-COV-2 Vaccination 06/29/2019, 07/21/2019  ?  ? ?Objective: ?Vital Signs: BP (!) 145/77 (BP Location: Left Arm, Patient Position: Sitting, Cuff Size:   ? ?Office Visit Note ? ?Patient: Hailey Duncan             ?Date of Birth: 08/31/1970           ?MRN: [id number]             ?PCP: Hammer, Eli, MD ?Referring: Holland, Richard, MD ?Visit Date: 06/06/2021 ?Occupation: @GUAROCC@ ? ?Subjective:  ?Dry mouth and dry eyes ? ?History of Present Illness: Hailey Duncan is a 50 y.o. female with history of sicca symptoms and osteoarthritis.  She continues to have dry mouth and dry eyes.  She states her symptoms are very well controlled with pilocarpine 2.5 mg 2-3 times a day.  She states she developed strep throat in February which was treated with Z-Pak.  After that she developed a sinus infection.  Since then she has been having increased tearing in her eyes.  She states her dry eye symptoms are well controlled with pilocarpine.  She denies any history of oral ulcers, nasal ulcers, malar rash, photosensitivity, Raynaud's phenomenon or lymphadenopathy.  She states her hands and stay cool during the colder weather. ? ?Activities of Daily Living:  ?Patient reports morning stiffness for 15-20  minutes.   ?Patient Reports nocturnal pain.  ?Difficulty dressing/grooming: Denies ?Difficulty climbing stairs: Denies ?Difficulty getting out of chair: Denies ?Difficulty using hands for taps, buttons, cutlery, and/or writing: Denies ? ?Review of Systems  ?Constitutional:  Positive for fatigue.  ?HENT:  Negative for mouth sores, mouth dryness and nose dryness.   ?Eyes:  Negative for pain, itching, visual disturbance and dryness.  ?Respiratory:  Negative for shortness of breath and difficulty breathing.   ?Cardiovascular:  Negative for chest pain and palpitations.  ?Gastrointestinal:  Negative for blood in stool, constipation and diarrhea.  ?Endocrine: Negative for increased urination.  ?Genitourinary:  Negative for difficulty urinating.  ?Musculoskeletal:  Positive for myalgias, morning stiffness and myalgias. Negative for joint pain, joint pain, joint swelling and muscle  tenderness.  ?Skin:  Negative for color change, rash and redness.  ?Allergic/Immunologic: Negative for susceptible to infections.  ?Neurological:  Positive for numbness. Negative for dizziness, headaches, memory loss and weakness.  ?Hematological:  Negative for bruising/bleeding tendency.  ?Psychiatric/Behavioral:  Negative for confusion.   ? ?PMFS History:  ?Patient Active Problem List  ? Diagnosis Date Noted  ? Keratoconjunctivitis sicca (HCC) 08/31/2020  ? Lateral epicondylitis 08/31/2020  ? Osteoarthritis 08/31/2020  ? Osteopenia 08/31/2020  ? Positive antinuclear antibody 08/31/2020  ? Sicca syndrome, unspecified 03/14/2016  ? Primary osteoarthritis of both feet 03/14/2016  ? Primary osteoarthritis of both hands 03/14/2016  ? Spondylosis of lumbar region without myelopathy or radiculopathy 03/14/2016  ?  ?Past Medical History:  ?Diagnosis Date  ? Cervical arthritis   ? Osteoarthritis   ? Sicca syndrome, unspecified   ?  ?Family History  ?Problem Relation Age of Onset  ? Macular degeneration Mother   ? Seizures Mother   ?     under control   ? Hypertension Mother   ? Hypertension Father   ? Hypercholesterolemia Father   ? Tremor Father   ? Hypercholesterolemia Sister   ? ?Past Surgical History:  ?Procedure Laterality Date  ? BUNIONECTOMY    ? ?Social History  ? ?Social History Narrative  ? Not on file  ? ?Immunization History  ?Administered Date(s) Administered  ? MMR 09/14/2010  ? PFIZER(Purple Top)SARS-COV-2 Vaccination 06/29/2019, 07/21/2019  ?  ? ?Objective: ?Vital Signs: BP (!) 145/77 (BP Location: Left Arm, Patient Position: Sitting, Cuff Size:

## 2021-06-06 ENCOUNTER — Encounter: Payer: Self-pay | Admitting: Rheumatology

## 2021-06-06 ENCOUNTER — Ambulatory Visit: Payer: 59 | Admitting: Rheumatology

## 2021-06-06 ENCOUNTER — Other Ambulatory Visit: Payer: Self-pay

## 2021-06-06 VITALS — BP 145/77 | HR 92 | Ht 65.0 in | Wt 114.8 lb

## 2021-06-06 DIAGNOSIS — M19071 Primary osteoarthritis, right ankle and foot: Secondary | ICD-10-CM

## 2021-06-06 DIAGNOSIS — M19072 Primary osteoarthritis, left ankle and foot: Secondary | ICD-10-CM

## 2021-06-06 DIAGNOSIS — M7061 Trochanteric bursitis, right hip: Secondary | ICD-10-CM | POA: Diagnosis not present

## 2021-06-06 DIAGNOSIS — M47816 Spondylosis without myelopathy or radiculopathy, lumbar region: Secondary | ICD-10-CM

## 2021-06-06 DIAGNOSIS — M3500 Sicca syndrome, unspecified: Secondary | ICD-10-CM

## 2021-06-06 DIAGNOSIS — Z8739 Personal history of other diseases of the musculoskeletal system and connective tissue: Secondary | ICD-10-CM

## 2021-06-06 DIAGNOSIS — M19041 Primary osteoarthritis, right hand: Secondary | ICD-10-CM

## 2021-06-06 DIAGNOSIS — M19042 Primary osteoarthritis, left hand: Secondary | ICD-10-CM

## 2021-06-06 DIAGNOSIS — M5030 Other cervical disc degeneration, unspecified cervical region: Secondary | ICD-10-CM

## 2021-06-06 DIAGNOSIS — R768 Other specified abnormal immunological findings in serum: Secondary | ICD-10-CM | POA: Diagnosis not present

## 2021-06-06 DIAGNOSIS — Z9889 Other specified postprocedural states: Secondary | ICD-10-CM

## 2021-06-06 DIAGNOSIS — M7712 Lateral epicondylitis, left elbow: Secondary | ICD-10-CM

## 2021-06-06 DIAGNOSIS — M79671 Pain in right foot: Secondary | ICD-10-CM

## 2021-11-21 NOTE — Progress Notes (Signed)
Office Visit Note  Patient: Hailey Duncan             Date of Birth: 1970/04/22           MRN: [id number]             PCP: Collene Leyden, MD Referring: Collene Leyden, MD Visit Date: 12/05/2021 Occupation: @GUAROCC @  Subjective:  Dry mouth and dry eyes  History of Present Illness: Hailey Duncan is a 51 y.o. female with history of sicca symptoms and osteoarthritis.  She states she continues to have dry mouth and dry eyes symptoms.  She has been taking pilocarpine 2.5 mg 2-3 times a day which helps her symptoms.  She states that recently her family was exposed to COVID-19 but she did not develop any symptoms of COVID-19.  She denies any history of oral ulcers, nasal ulcers, malar rash, photosensitivity or Raynaud's phenomenon.  She denies any history of joint swelling.  She has been experiencing increased pain and discomfort in her right trochanteric bursa and difficulty sleeping on her right side.  Activities of Daily Living:  Patient reports morning stiffness for 15-20 minutes.   Patient Reports nocturnal pain.  Difficulty dressing/grooming: Denies Difficulty climbing stairs: Denies Difficulty getting out of chair: Denies Difficulty using hands for taps, buttons, cutlery, and/or writing: Denies  Review of Systems  Constitutional:  Positive for fatigue.  HENT:  Negative for mouth sores and mouth dryness.   Eyes:  Negative for dryness.  Respiratory:  Negative for shortness of breath.   Cardiovascular:  Negative for chest pain and palpitations.  Gastrointestinal:  Negative for blood in stool, constipation and diarrhea.  Endocrine: Negative for increased urination.  Genitourinary:  Negative for involuntary urination.  Musculoskeletal:  Positive for joint pain, joint pain, myalgias, morning stiffness, muscle tenderness and myalgias. Negative for gait problem, joint swelling and muscle weakness.  Skin:  Positive for hair loss. Negative for color change, rash and sensitivity to  sunlight.  Allergic/Immunologic: Negative for susceptible to infections.  Neurological:  Positive for dizziness and headaches.  Hematological:  Negative for swollen glands.  Psychiatric/Behavioral:  Positive for sleep disturbance. Negative for depressed mood. The patient is not nervous/anxious.     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   Lateral epicondylitis 08/31/2020   Osteoarthritis 08/31/2020   Osteopenia 08/31/2020   Positive antinuclear antibody 08/31/2020   Sicca syndrome, unspecified 03/14/2016   Primary osteoarthritis of both feet 03/14/2016   Primary osteoarthritis of both hands 03/14/2016   Spondylosis of lumbar region without myelopathy or radiculopathy 03/14/2016    Past Medical History:  Diagnosis Date   Cervical arthritis    Osteoarthritis    Sicca syndrome, unspecified     Family History  Problem Relation Age of Onset   Macular degeneration Mother    Seizures Mother        under control    Hypertension Mother    Hypertension Father    Hypercholesterolemia Father    Tremor Father    Hypercholesterolemia Sister    Past Surgical History:  Procedure Laterality Date   BUNIONECTOMY     Social History   Social History Narrative   Not on file   Immunization History  Administered Date(s) Administered   MMR 09/14/2010   PFIZER(Purple Top)SARS-COV-2 Vaccination 06/29/2019, 07/21/2019     Objective: Vital Signs: BP 117/75 (BP Location: Left Arm, Patient Position: Sitting, Cuff Size: Normal)   Pulse 75  Resp 16   Ht 5' 5"  (1.651 m)   Wt 115 lb 12.8 oz (52.5 kg)   BMI 19.27 kg/m    Physical Exam Vitals and nursing note reviewed.  Constitutional:      Appearance: She is well-developed.  HENT:     Head: Normocephalic and atraumatic.  Eyes:     Conjunctiva/sclera: Conjunctivae normal.  Cardiovascular:     Rate and Rhythm: Normal rate and regular rhythm.     Heart sounds:  Normal heart sounds.  Pulmonary:     Effort: Pulmonary effort is normal.     Breath sounds: Normal breath sounds.  Abdominal:     General: Bowel sounds are normal.     Palpations: Abdomen is soft.  Musculoskeletal:     Cervical back: Normal range of motion.  Lymphadenopathy:     Cervical: No cervical adenopathy.  Skin:    General: Skin is warm and dry.     Capillary Refill: Capillary refill takes less than 2 seconds.  Neurological:     Mental Status: She is alert and oriented to person, place, and time.  Psychiatric:        Behavior: Behavior normal.      Musculoskeletal Exam: Cervical, thoracic and lumbar spine were in good range of motion.  Shoulder joints, elbow joints, wrist joints, MCPs PIPs and DIPs with good range of motion.  She had mild PIP and DIP thickening.  Hip joints and knee joints in good range of motion.  She had tenderness over right trochanteric bursa.  There was no tenderness over ankles or MTPs.  CDAI Exam: CDAI Score: -- Patient Global: --; Provider Global: -- Swollen: --; Tender: -- Joint Exam 12/05/2021   No joint exam has been documented for this visit   There is currently no information documented on the homunculus. Go to the Rheumatology activity and complete the homunculus joint exam.  Investigation: No additional findings.  Imaging: No results found.  Recent Labs: Lab Results  Component Value Date   WBC 4.4 06/10/2019   HGB 13.5 06/10/2019   PLT 173 06/10/2019   NA 136 03/18/2018   K 4.0 03/18/2018   CL 99 03/18/2018   CO2 28 03/18/2018   GLUCOSE 74 03/18/2018   BUN 13 03/18/2018   CREATININE 0.73 03/18/2018   BILITOT 0.7 03/18/2018   AST 19 03/18/2018   ALT 17 03/18/2018   PROT 7.8 03/18/2018   CALCIUM 10.1 03/18/2018   GFRAA 114 03/18/2018    Speciality Comments: No specialty comments available.  Procedures:  Large Joint Inj: R greater trochanter on 12/05/2021 10:19 AM Indications: pain Details: 27 G 1.5 in needle, lateral  approach  Arthrogram: No  Medications: 40 mg triamcinolone acetonide 40 MG/ML; 1.5 mL lidocaine 1 % Aspirate: 0 mL Outcome: tolerated well, no immediate complications Procedure, treatment alternatives, risks and benefits explained, specific risks discussed. Consent was given by the patient. Immediately prior to procedure a time out was called to verify the correct patient, procedure, equipment, support staff and site/side marked as required. Patient was prepped and draped in the usual sterile fashion.     Allergies: Clindamycin/lincomycin, Paba derivatives, and Penicillins   Assessment / Plan:     Visit Diagnoses: Sicca syndrome ([place]) - Ro- and La-were negative in 2019 and 2021.  ANA was low titer positive at 1: 40.  She continues to have dry mouth and dry eyes symptoms.  Her symptoms are manageable with over-the-counter products.  She has been using eyedrops and also uses  pilocarpine 2.5 mg 3 times a day as needed.  Prescription refill for pilocarpine was given today.  ANA positive -she denies any history of oral ulcers, nasal ulcers, malar rash, photosensitivity, Raynaud's phenomenon or lymphadenopathy.  There is no history of inflammatory arthritis.  ANA negative on 03/18/2018.  ENA negative, CCP negative, RF negative, sed rate within normal limits on 03/18/2017: Her last ANA titer was 1: 40.   Primary osteoarthritis of both hands-joint protection muscle strengthening was discussed.  Trochanteric bursitis, right hip-She has been having discomfort in the right trochanteric bursa.  Different treatment options and side effects were discussed.  After informed consent was obtained right trochanteric area was prepped in sterile fashion injected with lidocaine and cortisone as described above.  Patient tolerated the procedure well.  Postprocedure instructions were given.  A handout on stretching exercises was given.  Primary osteoarthritis of both feet-she denies any discomfort today.  History of  bunionectomy  DDD (degenerative disc disease), cervical-she had good range of motion without any radiculopathy.  Spondylosis of lumbar region without myelopathy or radiculopathy-she had good mobility without discomfort.  History of TMJ disorder - Resolved   Orders: Orders Placed This Encounter  Procedures   Large Joint Inj   Meds ordered this encounter  Medications   pilocarpine (SALAGEN) 5 MG tablet    Sig: 1 tablet p.o. twice daily as needed    Dispense:  192.85 tablet    Refill:  1     Follow-Up Instructions: Return in about 6 months (around 06/05/2022) for Osteoarthritis.   Bo Merino, MD  Note - This record has been created using Editor, commissioning.  Chart creation errors have been sought, but may not always  have been located. Such creation errors do not reflect on  the standard of medical care.

## 2021-12-05 ENCOUNTER — Encounter: Payer: Self-pay | Admitting: Rheumatology

## 2021-12-05 ENCOUNTER — Ambulatory Visit: Payer: 59 | Attending: Rheumatology | Admitting: Rheumatology

## 2021-12-05 VITALS — BP 117/75 | HR 75 | Resp 16 | Ht 65.0 in | Wt 115.8 lb

## 2021-12-05 DIAGNOSIS — R768 Other specified abnormal immunological findings in serum: Secondary | ICD-10-CM | POA: Diagnosis not present

## 2021-12-05 DIAGNOSIS — M3500 Sicca syndrome, unspecified: Secondary | ICD-10-CM | POA: Diagnosis not present

## 2021-12-05 DIAGNOSIS — Z9889 Other specified postprocedural states: Secondary | ICD-10-CM

## 2021-12-05 DIAGNOSIS — M5030 Other cervical disc degeneration, unspecified cervical region: Secondary | ICD-10-CM

## 2021-12-05 DIAGNOSIS — M19071 Primary osteoarthritis, right ankle and foot: Secondary | ICD-10-CM

## 2021-12-05 DIAGNOSIS — Z8739 Personal history of other diseases of the musculoskeletal system and connective tissue: Secondary | ICD-10-CM

## 2021-12-05 DIAGNOSIS — M19072 Primary osteoarthritis, left ankle and foot: Secondary | ICD-10-CM

## 2021-12-05 DIAGNOSIS — M47816 Spondylosis without myelopathy or radiculopathy, lumbar region: Secondary | ICD-10-CM

## 2021-12-05 DIAGNOSIS — M7061 Trochanteric bursitis, right hip: Secondary | ICD-10-CM

## 2021-12-05 DIAGNOSIS — M19041 Primary osteoarthritis, right hand: Secondary | ICD-10-CM | POA: Diagnosis not present

## 2021-12-05 DIAGNOSIS — M19042 Primary osteoarthritis, left hand: Secondary | ICD-10-CM

## 2021-12-05 MED ORDER — TRIAMCINOLONE ACETONIDE 40 MG/ML IJ SUSP
40.0000 mg | INTRAMUSCULAR | Status: AC | PRN
  Administered 2021-12-05: 40 mg via INTRA_ARTICULAR

## 2021-12-05 MED ORDER — PILOCARPINE HCL 5 MG PO TABS: ORAL_TABLET | ORAL | 1 refills | Status: DC

## 2021-12-05 MED ORDER — LIDOCAINE HCL 1 % IJ SOLN
1.5000 mL | INTRAMUSCULAR | Status: AC | PRN
  Administered 2021-12-05: 1.5 mL

## 2021-12-05 NOTE — Patient Instructions (Signed)
Iliotibial Band Syndrome Rehab Ask your health care provider which exercises are safe for you. Do exercises exactly as told by your health care provider and adjust them as directed. It is normal to feel mild stretching, pulling, tightness, or discomfort as you do these exercises. Stop right away if you feel sudden pain or your pain gets significantly worse. Do not begin these exercises until told by your health care provider. Stretching and range-of-motion exercises These exercises warm up your muscles and joints and improve the movement and flexibility of your hip and pelvis. Quadriceps stretch, prone  Lie on your abdomen (prone position) on a firm surface, such as a bed or padded floor. Bend your left / right knee and reach back to hold your ankle or pant leg. If you cannot reach your ankle or pant leg, loop a belt around your foot and grab the belt instead. Gently pull your heel toward your buttocks. Your knee should not slide out to the side. You should feel a stretch in the front of your thigh and knee (quadriceps). Hold this position for __________ seconds. Repeat __________ times. Complete this exercise __________ times a day. Iliotibial band stretch An iliotibial band is a strong band of muscle tissue that runs from the outer side of your hip to the outer side of your thigh and knee. Lie on your side with your left / right leg in the top position. Bend both of your knees and grab your left / right ankle. Stretch out your bottom arm to help you balance. Slowly bring your top knee back so your thigh goes behind your trunk. Slowly lower your top leg toward the floor until you feel a gentle stretch on the outside of your left / right hip and thigh. If you do not feel a stretch and your knee will not fall farther, place the heel of your other foot on top of your knee and pull your knee down toward the floor with your foot. Hold this position for __________ seconds. Repeat __________ times.  Complete this exercise __________ times a day. Strengthening exercises These exercises build strength and endurance in your hip and pelvis. Endurance is the ability to use your muscles for a long time, even after they get tired. Straight leg raises, side-lying This exercise strengthens the muscles that rotate the leg at the hip and move it away from your body (hip abductors). Lie on your side with your left / right leg in the top position. Lie so your head, shoulder, hip, and knee line up. You may bend your bottom knee to help you balance. Roll your hips slightly forward so your hips are stacked directly over each other and your left / right knee is facing forward. Tense the muscles in your outer thigh and lift your top leg 4-6 inches (10-15 cm). Hold this position for __________ seconds. Slowly lower your leg to return to the starting position. Let your muscles relax completely before doing another repetition. Repeat __________ times. Complete this exercise __________ times a day. Leg raises, prone This exercise strengthens the muscles that move the hips backward (hip extensors). Lie on your abdomen (prone position) on your bed or a firm surface. You can put a pillow under your hips if that is more comfortable for your lower back. Bend your left / right knee so your foot is straight up in the air. Squeeze your buttocks muscles and lift your left / right thigh off the bed. Do not let your back arch. Tense   your thigh muscle as hard as you can without increasing any knee pain. Hold this position for __________ seconds. Slowly lower your leg to return to the starting position and allow it to relax completely. Repeat __________ times. Complete this exercise __________ times a day. Hip hike Stand sideways on a bottom step. Stand on your left / right leg with your other foot unsupported next to the step. You can hold on to a railing or wall for balance if needed. Keep your knees straight and your  torso square. Then lift your left / right hip up toward the ceiling. Slowly let your left / right hip lower toward the floor, past the starting position. Your foot should get closer to the floor. Do not lean or bend your knees. Repeat __________ times. Complete this exercise __________ times a day. This information is not intended to replace advice given to you by your health care provider. Make sure you discuss any questions you have with your health care provider. Document Revised: 05/26/2019 Document Reviewed: 05/26/2019 Elsevier Patient Education  2023 [employer].  

## 2022-01-03 ENCOUNTER — Encounter: Payer: Self-pay | Admitting: Gastroenterology

## 2022-02-18 ENCOUNTER — Ambulatory Visit (AMBULATORY_SURGERY_CENTER): Payer: Self-pay

## 2022-02-18 VITALS — Ht 65.0 in | Wt 116.0 lb

## 2022-02-18 DIAGNOSIS — Z1211 Encounter for screening for malignant neoplasm of colon: Secondary | ICD-10-CM

## 2022-02-18 MED ORDER — NA SULFATE-K SULFATE-MG SULF 17.5-3.13-1.6 GM/177ML PO SOLN: 1.0000 | Freq: Once | ORAL | 0 refills | Status: AC

## 2022-02-18 NOTE — Progress Notes (Signed)

## 2022-03-26 ENCOUNTER — Encounter: Payer: Self-pay | Admitting: Gastroenterology

## 2022-03-29 ENCOUNTER — Ambulatory Visit (AMBULATORY_SURGERY_CENTER): Payer: 59 | Admitting: Gastroenterology

## 2022-03-29 ENCOUNTER — Encounter: Payer: Self-pay | Admitting: Gastroenterology

## 2022-03-29 VITALS — BP 125/82 | HR 81 | Temp 97.3°F | Resp 18 | Ht 65.0 in | Wt 116.0 lb

## 2022-03-29 DIAGNOSIS — D128 Benign neoplasm of rectum: Secondary | ICD-10-CM

## 2022-03-29 DIAGNOSIS — D122 Benign neoplasm of ascending colon: Secondary | ICD-10-CM

## 2022-03-29 DIAGNOSIS — Z1211 Encounter for screening for malignant neoplasm of colon: Secondary | ICD-10-CM

## 2022-03-29 DIAGNOSIS — K621 Rectal polyp: Secondary | ICD-10-CM | POA: Diagnosis not present

## 2022-03-29 HISTORY — PX: COLONOSCOPY: SHX174

## 2022-03-29 MED ORDER — SODIUM CHLORIDE 0.9 % IV SOLN: 500.0000 mL | INTRAVENOUS | Status: DC

## 2022-03-29 NOTE — Op Note (Signed)
[facility] Patient Name: Hailey Duncan Procedure Date: 03/29/2022 9:03 AM MRN: [id number] Endoscopist: Nicki Reaper E. Candis Schatz , MD, [id number] Age: 51 Referring MD:  Date of Birth: 11/07/1970 Gender: Female Account #: [id number] Procedure:                Colonoscopy Indications:              Screening for colon cancer: Family history of                            colorectal cancer in multiple 2nd degree relatives Medicines:                Monitored Anesthesia Care Procedure:                Pre-Anesthesia Assessment:                           - Prior to the procedure, a History and Physical                            was performed, and patient medications and                            allergies were reviewed. The patient's tolerance of                            previous anesthesia was also reviewed. The risks                            and benefits of the procedure and the sedation                            options and risks were discussed with the patient.                            All questions were answered, and informed consent                            was obtained. Prior Anticoagulants: The patient has                            taken no anticoagulant or antiplatelet agents. ASA                            Grade Assessment: I - A normal, healthy patient.                            After reviewing the risks and benefits, the patient                            was deemed in satisfactory condition to undergo the                            procedure.  After obtaining informed consent, the colonoscope                            was passed under direct vision. Throughout the                            procedure, the patient's blood pressure, pulse, and                            oxygen saturations were monitored continuously. The                            Olympus PCF-H190DL (#[id number]) Colonoscope was                            introduced through  the anus and advanced to the the                            cecum, identified by appendiceal orifice and                            ileocecal valve. The colonoscopy was somewhat                            difficult due to a tortuous colon. Successful                            completion of the procedure was aided by using                            manual pressure. The patient tolerated the                            procedure well. The quality of the bowel                            preparation was excellent. The ileocecal valve,                            appendiceal orifice, and rectum were photographed.                            The bowel preparation used was SUPREP via split                            dose instruction. Scope In: 9:17:37 AM Scope Out: 9:40:48 AM Scope Withdrawal Time: 0 hours 15 minutes 49 seconds  Total Procedure Duration: 0 hours 23 minutes 11 seconds  Findings:                 The perianal and digital rectal examinations were                            normal. Pertinent negatives include normal  sphincter tone and no palpable rectal lesions.                           A 1 mm polyp was found in the ascending colon. The                            polyp was sessile. The polyp was removed with a                            cold biopsy forceps. Resection and retrieval were                            complete. Estimated blood loss was minimal.                           A 4 mm polyp was found in the proximal rectum. The                            polyp was sessile. The polyp was removed with a                            cold snare. Resection and retrieval were complete.                            Estimated blood loss was minimal.                           A few small-mouthed diverticula were found in the                            sigmoid colon.                           The exam was otherwise normal throughout the                             examined colon.                           The retroflexed view of the distal rectum and anal                            verge was normal and showed no anal or rectal                            abnormalities. Complications:            No immediate complications. Estimated Blood Loss:     Estimated blood loss was minimal. Impression:               - One 1 mm polyp in the ascending colon, removed                            with a cold biopsy forceps. Resected and retrieved.                           -  One 4 mm polyp in the proximal rectum, removed                            with a cold snare. Resected and retrieved.                           - Diverticulosis in the sigmoid colon.                           - The distal rectum and anal verge are normal on                            retroflexion view. Recommendation:           - Patient has a contact number available for                            emergencies. The signs and symptoms of potential                            delayed complications were discussed with the                            patient. Return to normal activities tomorrow.                            Written discharge instructions were provided to the                            patient.                           - Resume previous diet.                           - Continue present medications.                           - Await pathology results.                           - Repeat colonoscopy (date not yet determined) for                            surveillance based on pathology results. Chiann Goffredo E. Candis Schatz, MD 03/29/2022 9:48:30 AM This report has been signed electronically.

## 2022-03-29 NOTE — Progress Notes (Signed)
Report to pacu rn. Vss. Care resumed by rn. 

## 2022-03-29 NOTE — Patient Instructions (Signed)
Discharge instructions given. Handouts on polyps and Diverticulosis. Resume previous medications. YOU HAD AN ENDOSCOPIC PROCEDURE TODAY AT THE [facility]:   Refer to the procedure report that was given to you for any specific questions about what was found during the examination.  If the procedure report does not answer your questions, please call your gastroenterologist to clarify.  If you requested that your care partner not be given the details of your procedure findings, then the procedure report has been included in a sealed envelope for you to review at your convenience later.  YOU SHOULD EXPECT: Some feelings of bloating in the abdomen. Passage of more gas than usual.  Walking can help get rid of the air that was put into your GI tract during the procedure and reduce the bloating. If you had a lower endoscopy (such as a colonoscopy or flexible sigmoidoscopy) you may notice spotting of blood in your stool or on the toilet paper. If you underwent a bowel prep for your procedure, you may not have a normal bowel movement for a few days.  Please Note:  You might notice some irritation and congestion in your nose or some drainage.  This is from the oxygen used during your procedure.  There is no need for concern and it should clear up in a day or so.  SYMPTOMS TO REPORT IMMEDIATELY:  Following lower endoscopy (colonoscopy or flexible sigmoidoscopy):  Excessive amounts of blood in the stool  Significant tenderness or worsening of abdominal pains  Swelling of the abdomen that is new, acute  Fever of 100F or higher  For urgent or emergent issues, a gastroenterologist can be reached at any hour by calling [phone]. Do not use MyChart messaging for urgent concerns.    DIET:  We do recommend a small meal at first, but then you may proceed to your regular diet.  Drink plenty of fluids but you should avoid alcoholic beverages for 24 hours.  ACTIVITY:  You should plan to take it  easy for the rest of today and you should NOT DRIVE or use heavy machinery until tomorrow (because of the sedation medicines used during the test).    FOLLOW UP: Our staff will call the number listed on your records the next business day following your procedure.  We will call around 7:15- 8:00 am to check on you and address any questions or concerns that you may have regarding the information given to you following your procedure. If we do not reach you, we will leave a message.     If any biopsies were taken you will be contacted by phone or by letter within the next 1-3 weeks.  Please call us at [phone] if you have not heard about the biopsies in 3 weeks.    SIGNATURES/CONFIDENTIALITY: You and/or your care partner have signed paperwork which will be entered into your electronic medical record.  These signatures attest to the fact that that the information above on your After Visit Summary has been reviewed and is understood.  Full responsibility of the confidentiality of this discharge information lies with you and/or your care-partner. 

## 2022-03-29 NOTE — Progress Notes (Signed)
Pt's states no medical or surgical changes since previsit or office visit. 

## 2022-03-29 NOTE — Progress Notes (Signed)
Called to room to assist during endoscopic procedure.  Patient ID and intended procedure confirmed with present staff. Received instructions for my participation in the procedure from the performing physician.  

## 2022-03-29 NOTE — Progress Notes (Signed)
[facility] History and Physical   Primary Care Physician:  Collene Leyden, MD   Reason for Procedure:   Colon cancer screening  Plan:    Screening colonoscopy     HPI: Hailey Duncan is a 51 y.o. female undergoing initial average risk screening colonoscopy.  She has no chronic GI symptoms.  She has three family members with colon cancer on her mother's side (aunts/uncles).     Past Medical History:  Diagnosis Date   Cervical arthritis    Osteoarthritis    Sicca syndrome, unspecified     Past Surgical History:  Procedure Laterality Date   BUNIONECTOMY Bilateral 2000    Prior to Admission medications   Medication Sig Start Date End Date Taking? Authorizing Provider  ASTRAGALUS PO Take by mouth daily.   Yes [provider]  BIOTIN PO Take by mouth daily.   Yes [provider]  Cholecalciferol (VITAMIN D PO) Take by mouth.   Yes [provider]  CRANBERRY PO Take by mouth daily.   Yes [provider]  Flaxseed, Linseed, (FLAX SEED OIL) 1000 MG CAPS Take 1,000 mg by mouth.   Yes [provider]  ibuprofen (ADVIL,MOTRIN) 200 MG tablet Take 200 mg by mouth every 6 (six) hours as needed.   Yes [provider]  Lactobacillus (PROBIOTIC ACIDOPHILUS PO) Take by mouth daily.   Yes [provider]  LO LOESTRIN FE 1 MG-10 MCG / 10 MCG tablet TK 1 T PO D UTD 02/26/16  Yes [provider]  MAGNESIUM PO Take by mouth daily.   Yes [provider]  pilocarpine (SALAGEN) 5 MG tablet 1 tablet p.o. twice daily as needed 12/05/21  Yes Deveshwar, Abel Presto, MD  Turmeric 500 MG CAPS Take 2,000 mg by mouth daily.    Yes [provider]  vitamin B-12 (CYANOCOBALAMIN) 1000 MCG tablet Take 1,000 mcg by mouth daily.   Yes [provider]  Wheat Dextrin (BENEFIBER PO) Take by mouth.   Yes [provider]  APPLE CIDER VINEGAR PO Take by mouth daily. Patient not taking: Reported on  02/18/2022    [provider]  diclofenac sodium (VOLTAREN) 1 % GEL 3 grams to 3 large joints up to 3 times daily Patient not taking: Reported on 02/18/2022 07/24/17   Ofilia Neas, PA-C    Current Outpatient Medications  Medication Sig Dispense Refill   ASTRAGALUS PO Take by mouth daily.     BIOTIN PO Take by mouth daily.     Cholecalciferol (VITAMIN D PO) Take by mouth.     CRANBERRY PO Take by mouth daily.     Flaxseed, Linseed, (FLAX SEED OIL) 1000 MG CAPS Take 1,000 mg by mouth.     ibuprofen (ADVIL,MOTRIN) 200 MG tablet Take 200 mg by mouth every 6 (six) hours as needed.     Lactobacillus (PROBIOTIC ACIDOPHILUS PO) Take by mouth daily.     LO LOESTRIN FE 1 MG-10 MCG / 10 MCG tablet TK 1 T PO D UTD  0   MAGNESIUM PO Take by mouth daily.     pilocarpine (SALAGEN) 5 MG tablet 1 tablet p.o. twice daily as needed 192.85 tablet 1   Turmeric 500 MG CAPS Take 2,000 mg by mouth daily.      vitamin B-12 (CYANOCOBALAMIN) 1000 MCG tablet Take 1,000 mcg by mouth daily.     Wheat Dextrin (BENEFIBER PO) Take by mouth.     APPLE CIDER VINEGAR PO Take by mouth daily. (Patient  not taking: Reported on 02/18/2022)     diclofenac sodium (VOLTAREN) 1 % GEL 3 grams to 3 large joints up to 3 times daily (Patient not taking: Reported on 02/18/2022) 3 Tube 3   Current Facility-Administered Medications  Medication Dose Route Frequency Provider Last Rate Last Admin   0.9 %  sodium chloride infusion  500 mL Intravenous Continuous Daryel November, MD        Allergies as of 03/29/2022 - Review Complete 03/29/2022  Allergen Reaction Noted   Clindamycin/lincomycin Rash 03/18/2016   Paba derivatives Other (See Comments) 01/02/2021   Penicillins Rash 03/18/2016    Family History  Problem Relation Age of Onset   Macular degeneration Mother    Seizures Mother        under control    Hypertension Mother    Hypertension Father    Hypercholesterolemia Father    Tremor Father     Hypercholesterolemia Sister    Colon cancer Maternal Aunt    Colon cancer Maternal Uncle    Colon polyps Neg Hx    Esophageal cancer Neg Hx    Rectal cancer Neg Hx    Stomach cancer Neg Hx     Social History   Socioeconomic History   Marital status: Married    Spouse name: Not on file   Number of children: Not on file   Years of education: Not on file   Highest education level: Not on file  Occupational History   Not on file  Tobacco Use   Smoking status: Never    Passive exposure: Never   Smokeless tobacco: Never  Vaping Use   Vaping Use: Never used  Substance and Sexual Activity   Alcohol use: Yes    Alcohol/week: 1.0 standard drink of alcohol    Types: 1 Glasses of wine per week    Comment: occ   Drug use: Never   Sexual activity: Yes    Birth control/protection: Pill  Other Topics Concern   Not on file  Social History Narrative   Not on file   Social Determinants of Health   Financial Resource Strain: Not on file  Food Insecurity: Not on file  Transportation Needs: Not on file  Physical Activity: Not on file  Stress: Not on file  Social Connections: Not on file  Intimate Partner Violence: Not on file    Review of Systems:  All other review of systems negative except as mentioned in the HPI.  Physical Exam: Vital signs BP 121/80   Pulse 96   Temp (!) 97.3 F (36.3 C)   Ht '5\' 5"'$  (1.651 m)   Wt 116 lb (52.6 kg)   LMP  (LMP Unknown)   SpO2 100%   BMI 19.30 kg/m   General:   Alert,  Well-developed, well-nourished, pleasant and cooperative in NAD Airway:  Mallampati 1 Lungs:  Clear throughout to auscultation.   Heart:  Regular rate and rhythm; no murmurs, clicks, rubs,  or gallops. Abdomen:  Soft, nontender and nondistended. Normal bowel sounds.   Neuro/Psych:  Normal mood and affect. A and O x 3   Daisy Lites E. Candis Schatz, MD [facility]

## 2022-04-03 ENCOUNTER — Telehealth: Payer: Self-pay

## 2022-04-03 NOTE — Telephone Encounter (Signed)
  Follow up Call-     03/29/2022    8:27 AM  Call back number  Post procedure Call Back phone  # [phone]  Permission to leave phone message Yes     Patient questions:  Do you have a fever, pain , or abdominal swelling? No. Pain Score  0 *  Have you tolerated food without any problems? Yes.    Have you been able to return to your normal activities? Yes.    Do you have any questions about your discharge instructions: Diet   No. Medications  No. Follow up visit  No.  Do you have questions or concerns about your Care? No.  Actions: * If pain score is 4 or above: No action needed, pain <4.

## 2022-04-08 ENCOUNTER — Encounter: Payer: Self-pay | Admitting: Gastroenterology

## 2022-05-21 ENCOUNTER — Telehealth: Payer: Self-pay | Admitting: Gastroenterology

## 2022-05-21 NOTE — Telephone Encounter (Signed)
Inbound call from pt,she is requesting a phone call from a nurse , pt stated that after procedure 03/29/22 she is been having a lot issues , a lot of flatulence etc.Please advise

## 2022-05-22 NOTE — Progress Notes (Addendum)
Office Visit Note  Patient: Hailey Duncan             Date of Birth: [birth year]           MRN: [id number]             PCP: Irven Coe, MD Referring: Irven Coe, MD Visit Date: 06/05/2022 Occupation: @GUAROCC @  Subjective:  Follow-up (Feels like excessive hair loss in the last month)   History of Present Illness: Hailey Duncan is a 52 y.o. female history of sicca symptoms and osteoarthritis.  She states her dry mouth and dry eye symptoms are manageable with pilocarpine.  She also uses over-the-counter products.  She states that she had colonoscopy December 19,2023.  She had a lot of abdominal bloating and discomfort after the colonoscopy.  She started experiencing fatigue.  She states it took a while for her to recover from the colonoscopy.  In the mid January she developed a viral infection with high fever.  She states COVID-19 virus test was negative.  The symptoms lingered for a while.  She has been experiencing hair loss for the last months.  She continues to have some fatigue.  Had good response to right trochanteric bursa injection.  She states the discomfort in the right trochanteric bursitis coming back.  For the last 1 months she has been also having discomfort in her left shoulder which she describes when she sleeps on the left side.    Activities of Daily Living:  Patient reports morning stiffness for 15-20 minutes.   Patient Reports nocturnal pain.  Difficulty dressing/grooming: Denies Difficulty climbing stairs: Denies Difficulty getting out of chair: Denies Difficulty using hands for taps, buttons, cutlery, and/or writing: Denies  Review of Systems  Constitutional:  Positive for fatigue.  HENT: Negative.  Negative for mouth sores and mouth dryness.   Eyes: Negative.  Negative for dryness.  Respiratory: Negative.  Negative for shortness of breath.   Cardiovascular: Negative.  Negative for chest pain and palpitations.  Gastrointestinal: Negative.  Negative for  blood in stool, constipation and diarrhea.  Endocrine: Negative.  Negative for increased urination.  Genitourinary: Negative.  Negative for involuntary urination.  Musculoskeletal:  Positive for joint pain, joint pain, joint swelling, morning stiffness and muscle tenderness. Negative for gait problem, myalgias, muscle weakness and myalgias.  Skin:  Positive for hair loss. Negative for color change, rash and sensitivity to sunlight.  Allergic/Immunologic: Negative.  Negative for susceptible to infections.  Neurological: Negative.  Negative for dizziness and headaches.  Hematological: Negative.  Negative for swollen glands.  Psychiatric/Behavioral:  Positive for depressed mood and sleep disturbance. The patient is not nervous/anxious.     PMFS History:  Patient Active Problem List   Diagnosis Date Noted   Keratoconjunctivitis sicca (HCC) 08/31/2020   Lateral epicondylitis 08/31/2020   Osteoarthritis 08/31/2020   Osteopenia 08/31/2020   Positive antinuclear antibody 08/31/2020   Sicca syndrome, unspecified 03/14/2016   Primary osteoarthritis of both feet 03/14/2016   Primary osteoarthritis of both hands 03/14/2016   Spondylosis without myelopathy 03/14/2016    Past Medical History:  Diagnosis Date   Cervical arthritis    Osteoarthritis    Sicca syndrome, unspecified     Family History  Problem Relation Age of Onset   Macular degeneration Mother    Seizures Mother        under control    Hypertension Mother    Hypertension Father    Hypercholesterolemia Father    Tremor Father  Hypercholesterolemia Sister    Colon cancer Maternal Aunt    Colon cancer Maternal Uncle    Colon polyps Neg Hx    Esophageal cancer Neg Hx    Rectal cancer Neg Hx    Stomach cancer Neg Hx    Past Surgical History:  Procedure Laterality Date   BUNIONECTOMY Bilateral 2000   COLONOSCOPY  03/29/2022   polyp- precancerous   Social History   Social History Narrative   Not on file    Immunization History  Administered Date(s) Administered   MMR 09/14/2010   PFIZER(Purple Top)SARS-COV-2 Vaccination 06/29/2019, 07/21/2019     Objective: Vital Signs: BP 126/85 (BP Location: Left Arm, Patient Position: Sitting, Cuff Size: Normal)   Pulse 77   Resp 16   Ht 5\' 5"  (1.651 m)   Wt 116 lb 9.6 oz (52.9 kg)   LMP  (LMP Unknown)   BMI 19.40 kg/m    Physical Exam Vitals and nursing note reviewed.  Constitutional:      Appearance: She is well-developed.  HENT:     Head: Normocephalic and atraumatic.  Eyes:     Conjunctiva/sclera: Conjunctivae normal.  Cardiovascular:     Rate and Rhythm: Normal rate and regular rhythm.     Heart sounds: Normal heart sounds.  Pulmonary:     Effort: Pulmonary effort is normal.     Breath sounds: Normal breath sounds.  Abdominal:     General: Bowel sounds are normal.     Palpations: Abdomen is soft.  Musculoskeletal:     Cervical back: Normal range of motion.  Lymphadenopathy:     Cervical: No cervical adenopathy.  Skin:    General: Skin is warm and dry.     Capillary Refill: Capillary refill takes less than 2 seconds.  Neurological:     Mental Status: She is alert and oriented to person, place, and time.  Psychiatric:        Behavior: Behavior normal.      Musculoskeletal Exam: Cervical, thoracic and lumbar spine were in good range of motion.  She had discomfort with abduction of the left shoulder joint.  And tenderness over subacromial region.  Bilateral shoulder joints with good range of motion.  Elbow joints, wrist joints, MCPs PIPs and DIPs been good range of motion with no synovitis.  Hip joints were in good range of motion.  She had mild tenderness over right trochanteric bursa.  Knee joints and ankle joints were in good range of motion without any tenderness.  There was no tenderness over MTPs.  CDAI Exam: CDAI Score: -- Patient Global: --; Provider Global: -- Swollen: --; Tender: -- Joint Exam 06/05/2022   No  joint exam has been documented for this visit   There is currently no information documented on the homunculus. Go to the Rheumatology activity and complete the homunculus joint exam.  Investigation: No additional findings.  Imaging: No results found.  Recent Labs: Lab Results  Component Value Date   WBC 4.4 06/10/2019   HGB 13.5 06/10/2019   PLT 173 06/10/2019   NA 136 03/18/2018   K 4.0 03/18/2018   CL 99 03/18/2018   CO2 28 03/18/2018   GLUCOSE 74 03/18/2018   BUN 13 03/18/2018   CREATININE 0.73 03/18/2018   BILITOT 0.7 03/18/2018   AST 19 03/18/2018   ALT 17 03/18/2018   PROT 7.8 03/18/2018   CALCIUM 10.1 03/18/2018   GFRAA 114 03/18/2018    Speciality Comments: No specialty comments available.  Procedures:  No  procedures performed Allergies: Clindamycin/lincomycin, Paba derivatives, and Penicillins   Assessment / Plan:     Visit Diagnoses: Sicca syndrome (HCC) - ANA 1: 40, SSB negative, SSA negative, sicca symptoms.  Pilocarpine 5 mg p.o. 2 times daily.  Sicca symptoms are manageable with pilocarpine.  She has been using over-the-counter products.  ANA positive - ANA negative, complements normal.  Hair loss-she has been experiencing hair loss for the last 2 months.  Patient states she got very sick after the colonoscopy in December followed by a viral infection.  She eventually recovered from viral infection which caused fever and upper respiratory tract infection.  She has been experiencing fatigue since then.  She has been also experiencing hair loss.  Mild hair thinning was noted.  Use of over-the-counter topical Rogaine and Nutrafol was advised.  Patient will review both agents and decide if she would like to use them.  She most likely have hair loss related to recent illness.  Acute pain of left shoulder-she has been experiencing discomfort in her left shoulder joint for the last 1 month.  She had tenderness over left subacromial region.  I gave her a handout on  exercises and advised topical Voltaren gel.  If her symptoms get worse we can consider cortisone injection in the future.  Primary osteoarthritis of both hands-she has mild PIP and DIP thickening.  Joint protection was discussed.  Trochanteric bursitis, right hip-she has intermittent pain in the right trochanteric region.  She has intermittent symptoms.  IT band stretches were discussed.  Primary osteoarthritis of both feet-currently not symptomatic.  History of bunionectomy-proper fitting shoe advised.  DDD (degenerative disc disease), cervical-she had good range of motion with no discomfort.  Spondylosis of lumbar region without myelopathy or radiculopathy-she had good mobility in her lower spine today.  Orders: No orders of the defined types were placed in this encounter.  No orders of the defined types were placed in this encounter.    Follow-Up Instructions: Return in about 6 months (around 12/06/2022) for sicca, OA.   Pollyann Savoy, MD  Note - This record has been created using Animal nutritionist.  Chart creation errors have been sought, but may not always  have been located. Such creation errors do not reflect on  the standard of medical care.

## 2022-05-22 NOTE — Telephone Encounter (Signed)
Pt states it took her a long time to recover from the colonoscopy. Reports she had pain, bloating, and constipation. Now she is having issues with lots of odiferous gas. Suggested that she could try adding a probiotic such as Align and try extra strength gas-x. Please advise of any other recommendations.,

## 2022-05-24 NOTE — Telephone Encounter (Signed)
Note sent to pt via mychart requesting exactly what the note for her work needs to state. Sent pt low fodmap diet.

## 2022-05-24 NOTE — Telephone Encounter (Signed)
Discussed with pt recommendations per Dr. Candis Schatz. She will try the fodmap diet and call back if that does not help for an appt.

## 2022-05-24 NOTE — Telephone Encounter (Signed)
Inbound call from pt , she want you to know the now she have access to [facility] and if you please send a note for her job and a summary of what you guys spoke about.Marland KitchenPlease advise

## 2022-06-05 ENCOUNTER — Ambulatory Visit: Payer: 59 | Attending: Rheumatology | Admitting: Rheumatology

## 2022-06-05 ENCOUNTER — Encounter: Payer: Self-pay | Admitting: Rheumatology

## 2022-06-05 VITALS — BP 126/85 | HR 77 | Resp 16 | Ht 65.0 in | Wt 116.6 lb

## 2022-06-05 DIAGNOSIS — L659 Nonscarring hair loss, unspecified: Secondary | ICD-10-CM

## 2022-06-05 DIAGNOSIS — M25512 Pain in left shoulder: Secondary | ICD-10-CM | POA: Diagnosis not present

## 2022-06-05 DIAGNOSIS — M19072 Primary osteoarthritis, left ankle and foot: Secondary | ICD-10-CM

## 2022-06-05 DIAGNOSIS — M7061 Trochanteric bursitis, right hip: Secondary | ICD-10-CM

## 2022-06-05 DIAGNOSIS — M19041 Primary osteoarthritis, right hand: Secondary | ICD-10-CM | POA: Diagnosis not present

## 2022-06-05 DIAGNOSIS — M19042 Primary osteoarthritis, left hand: Secondary | ICD-10-CM

## 2022-06-05 DIAGNOSIS — R768 Other specified abnormal immunological findings in serum: Secondary | ICD-10-CM

## 2022-06-05 DIAGNOSIS — M5030 Other cervical disc degeneration, unspecified cervical region: Secondary | ICD-10-CM

## 2022-06-05 DIAGNOSIS — M19071 Primary osteoarthritis, right ankle and foot: Secondary | ICD-10-CM

## 2022-06-05 DIAGNOSIS — M3500 Sicca syndrome, unspecified: Secondary | ICD-10-CM | POA: Diagnosis not present

## 2022-06-05 DIAGNOSIS — M7712 Lateral epicondylitis, left elbow: Secondary | ICD-10-CM

## 2022-06-05 DIAGNOSIS — M47816 Spondylosis without myelopathy or radiculopathy, lumbar region: Secondary | ICD-10-CM

## 2022-06-05 DIAGNOSIS — Z9889 Other specified postprocedural states: Secondary | ICD-10-CM

## 2022-06-05 NOTE — Patient Instructions (Signed)

## 2022-07-06 ENCOUNTER — Other Ambulatory Visit: Payer: Self-pay | Admitting: Rheumatology

## 2022-07-08 NOTE — Telephone Encounter (Signed)
Last Fill: 12/05/2021  Next Visit: 12/12/2022  Last Visit: 06/05/2022  Dx: Sicca syndrome   Current Dose per office note on 06/05/2022: Pilocarpine 5 mg p.o. 2 times daily   Okay to refill Pilocarpine?

## 2022-12-03 NOTE — Progress Notes (Signed)
Office Visit Note  Patient: Hailey Duncan             Date of Birth: September 26, 1970           MRN: [id number]             PCP: Irven Coe, MD Referring: Irven Coe, MD Visit Date: 12/12/2022 Occupation: @GUAROCC @  Subjective:  Right hand numbness  History of Present Illness: Hailey Duncan is a 52 y.o. female with history of sicca symptoms, osteoarthritis and degenerative disc disease.  She states about 4 months ago she suddenly turned back to look at her daughter and felt a crick in her neck and right shoulder.  She states since then she continues to have some stiffness in that area.  She has been having numbness in her right hand when she wakes up in the morning.  She continues to have numbness in her right fourth and fifth finger almost lasting all day.  She has difficulty driving.  The left shoulder pain resolved after the last visit.  She continues to have some stiffness in her hands.  Right trochanteric bursa pain has improved.  She continues to have intermittent discomfort in her lower back especially when she is doing gardening.  For the sicca symptoms she has been taking pilocarpine 5 mg, half tablet twice daily which helps her symptoms.    Activities of Daily Living:  Patient reports morning stiffness for all day. Patient Reports nocturnal pain.  Difficulty dressing/grooming: Reports Difficulty climbing stairs: Denies Difficulty getting out of chair: Denies Difficulty using hands for taps, buttons, cutlery, and/or writing: Reports  Review of Systems  Constitutional:  Negative for fatigue.  HENT:  Negative for mouth sores and mouth dryness.   Eyes:  Negative for dryness.  Respiratory:  Negative for shortness of breath.   Cardiovascular:  Negative for chest pain and palpitations.  Gastrointestinal:  Negative for blood in stool, constipation and diarrhea.  Endocrine: Negative for increased urination.  Genitourinary:  Negative for involuntary urination.   Musculoskeletal:  Positive for joint pain, joint pain, joint swelling, myalgias, morning stiffness, muscle tenderness and myalgias. Negative for gait problem and muscle weakness.  Skin:  Negative for color change, rash, hair loss and sensitivity to sunlight.  Allergic/Immunologic: Negative for susceptible to infections.  Neurological:  Positive for numbness. Negative for dizziness and headaches.  Hematological:  Negative for swollen glands.  Psychiatric/Behavioral:  Positive for sleep disturbance. Negative for depressed mood. The patient is not nervous/anxious.     PMFS History:  Patient Active Problem List   Diagnosis Date Noted   Keratoconjunctivitis sicca (HCC) 08/31/2020   Lateral epicondylitis 08/31/2020   Osteoarthritis 08/31/2020   Osteopenia 08/31/2020   Positive antinuclear antibody 08/31/2020   Sicca syndrome, unspecified 03/14/2016   Primary osteoarthritis of both feet 03/14/2016   Primary osteoarthritis of both hands 03/14/2016   Spondylosis without myelopathy 03/14/2016    Past Medical History:  Diagnosis Date   Cervical arthritis    Osteoarthritis    Sicca syndrome, unspecified     Family History  Problem Relation Age of Onset   Macular degeneration Mother    Seizures Mother        under control    Hypertension Mother    Hypertension Father    Hypercholesterolemia Father    Tremor Father    Hypercholesterolemia Sister    Colon cancer Maternal Aunt    Colon cancer Maternal Uncle    Colon polyps Neg Hx    Esophageal  cancer Neg Hx    Rectal cancer Neg Hx    Stomach cancer Neg Hx    Past Surgical History:  Procedure Laterality Date   BUNIONECTOMY Bilateral 2000   COLONOSCOPY  03/29/2022   polyp- precancerous   Social History   Social History Narrative   Not on file   Immunization History  Administered Date(s) Administered   MMR 09/14/2010   PFIZER(Purple Top)SARS-COV-2 Vaccination 06/29/2019, 07/21/2019     Objective: Vital Signs: BP 120/78  (BP Location: Left Arm, Patient Position: Sitting, Cuff Size: Normal)   Pulse 78   Resp 15   Ht 5\' 5"  (1.651 m)   Wt 118 lb 9.6 oz (53.8 kg)   LMP  (LMP Unknown)   BMI 19.74 kg/m    Physical Exam Vitals and nursing note reviewed.  Constitutional:      Appearance: She is well-developed.  HENT:     Head: Normocephalic and atraumatic.  Eyes:     Conjunctiva/sclera: Conjunctivae normal.  Cardiovascular:     Rate and Rhythm: Normal rate and regular rhythm.     Heart sounds: Normal heart sounds.  Pulmonary:     Effort: Pulmonary effort is normal.     Breath sounds: Normal breath sounds.  Abdominal:     General: Bowel sounds are normal.     Palpations: Abdomen is soft.  Musculoskeletal:     Cervical back: Normal range of motion.  Lymphadenopathy:     Cervical: No cervical adenopathy.  Skin:    General: Skin is warm and dry.     Capillary Refill: Capillary refill takes less than 2 seconds.  Neurological:     Mental Status: She is alert and oriented to person, place, and time.  Psychiatric:        Behavior: Behavior normal.      Musculoskeletal Exam: Cervical spine was in good range of motion.  She had some limitation with right lateral rotation.  She had radiculopathy to the right scapular region.  Right shoulder joint abduction was limited to 90 degrees.  She had limited internal rotation.  Left shoulder joint was in full range of motion.  Elbow joints, wrist joints, MCPs PIPs and DIPs with good range of motion with no synovitis.  Hip joints and knee joints in good range of motion without any warmth swelling or effusion.  There was no tenderness over ankles or MTPs.  CDAI Exam: CDAI Score: -- Patient Global: --; Provider Global: -- Swollen: --; Tender: -- Joint Exam 12/12/2022   No joint exam has been documented for this visit   There is currently no information documented on the homunculus. Go to the Rheumatology activity and complete the homunculus joint  exam.  Investigation: No additional findings.  Imaging: No results found.  Recent Labs: Lab Results  Component Value Date   WBC 4.4 06/10/2019   HGB 13.5 06/10/2019   PLT 173 06/10/2019   NA 136 03/18/2018   K 4.0 03/18/2018   CL 99 03/18/2018   CO2 28 03/18/2018   GLUCOSE 74 03/18/2018   BUN 13 03/18/2018   CREATININE 0.73 03/18/2018   BILITOT 0.7 03/18/2018   AST 19 03/18/2018   ALT 17 03/18/2018   PROT 7.8 03/18/2018   CALCIUM 10.1 03/18/2018   GFRAA 114 03/18/2018    Speciality Comments: No specialty comments available.  Procedures:  Large Joint Inj: R subacromial bursa on 12/12/2022 10:27 AM Indications: pain Details: 27 G 1.5 in needle, posterior approach  Arthrogram: No  Medications: 1 mL  lidocaine 1 %; 40 mg triamcinolone acetonide 40 MG/ML Aspirate: 0 mL Outcome: tolerated well, no immediate complications Procedure, treatment alternatives, risks and benefits explained, specific risks discussed. Consent was given by the patient. Immediately prior to procedure a time out was called to verify the correct patient, procedure, equipment, support staff and site/side marked as required. Patient was prepped and draped in the usual sterile fashion.     Allergies: Clindamycin/lincomycin, Paba derivatives, and Penicillins   Assessment / Plan:     Visit Diagnoses: Sicca syndrome (HCC) - ANA 1: 40, SSB negative, SSA negative, sicca symptoms.  Her dry mouth symptoms are controlled on pilocarpine 5 mg, half tablet p.o. 2 times daily.  ANA positive - ANA negative, complements normal.  Hair loss-improved gradually.  Chronic right shoulder pain-she has been having pain and discomfort in her right shoulder joint for the last 4 months.  She has difficulty with abduction and internal rotation.  After informed consent was obtained and different treatment options were discussed per patient's request right subacromial bursa was injected with lidocaine and Kenalog as described  above.  Patient tolerated the procedure well.  Postprocedure instructions were given.  A handout on exercises was given.  Primary osteoarthritis of both hands-she has osteoarthritis in her hands.  Joint protection muscle strengthening was discussed.  Trochanteric bursitis, right hip-her symptoms improved with IT band stretches.  Primary osteoarthritis of both feet-she has intermittent pain.  History of bunionectomy  DDD (degenerative disc disease), cervical-patient has known history of degenerative disease of cervical spine.  She has been having increased pain for the last 4 months.  She states the pain has been radiating into her right scapular region and also has been causing numbness and tingling in her right fourth and fifth fingers.  I will refer her to orthopedics for evaluation.  Patient has tried physical therapy in the past without much help.  I gave her a handout on exercises.  Numbness and tingling in right hand-patient will need further evaluation.  I will refer her to orthopedics.  Spondylosis of lumbar region without myelopathy or radiculopathy-she has intermittent pain in her lower back.  Patient states the pain gets worse when she is gardening.  Orders: Orders Placed This Encounter  Procedures   Large Joint Inj   AMB referral to orthopedics   No orders of the defined types were placed in this encounter.    Follow-Up Instructions: Return in about 6 months (around 06/11/2023) for Osteoarthritis, degenerative disc disease, sicca.   Pollyann Savoy, MD  Note - This record has been created using Animal nutritionist.  Chart creation errors have been sought, but may not always  have been located. Such creation errors do not reflect on  the standard of medical care.

## 2022-12-12 ENCOUNTER — Encounter: Payer: Self-pay | Admitting: Rheumatology

## 2022-12-12 ENCOUNTER — Ambulatory Visit: Payer: 59 | Attending: Rheumatology | Admitting: Rheumatology

## 2022-12-12 ENCOUNTER — Telehealth: Payer: Self-pay | Admitting: Rheumatology

## 2022-12-12 VITALS — BP 120/78 | HR 78 | Resp 15 | Ht 65.0 in | Wt 118.6 lb

## 2022-12-12 DIAGNOSIS — Z9889 Other specified postprocedural states: Secondary | ICD-10-CM

## 2022-12-12 DIAGNOSIS — R768 Other specified abnormal immunological findings in serum: Secondary | ICD-10-CM

## 2022-12-12 DIAGNOSIS — M19071 Primary osteoarthritis, right ankle and foot: Secondary | ICD-10-CM

## 2022-12-12 DIAGNOSIS — M19041 Primary osteoarthritis, right hand: Secondary | ICD-10-CM

## 2022-12-12 DIAGNOSIS — M19072 Primary osteoarthritis, left ankle and foot: Secondary | ICD-10-CM

## 2022-12-12 DIAGNOSIS — M25511 Pain in right shoulder: Secondary | ICD-10-CM | POA: Diagnosis not present

## 2022-12-12 DIAGNOSIS — M47816 Spondylosis without myelopathy or radiculopathy, lumbar region: Secondary | ICD-10-CM

## 2022-12-12 DIAGNOSIS — M7061 Trochanteric bursitis, right hip: Secondary | ICD-10-CM

## 2022-12-12 DIAGNOSIS — M19042 Primary osteoarthritis, left hand: Secondary | ICD-10-CM

## 2022-12-12 DIAGNOSIS — M3500 Sicca syndrome, unspecified: Secondary | ICD-10-CM | POA: Diagnosis not present

## 2022-12-12 DIAGNOSIS — G8929 Other chronic pain: Secondary | ICD-10-CM

## 2022-12-12 DIAGNOSIS — M25512 Pain in left shoulder: Secondary | ICD-10-CM

## 2022-12-12 DIAGNOSIS — R202 Paresthesia of skin: Secondary | ICD-10-CM

## 2022-12-12 DIAGNOSIS — L659 Nonscarring hair loss, unspecified: Secondary | ICD-10-CM

## 2022-12-12 DIAGNOSIS — M5030 Other cervical disc degeneration, unspecified cervical region: Secondary | ICD-10-CM

## 2022-12-12 DIAGNOSIS — R200 Anesthesia of skin: Secondary | ICD-10-CM

## 2022-12-12 MED ORDER — LIDOCAINE HCL 1 % IJ SOLN
1.0000 mL | INTRAMUSCULAR | Status: AC | PRN
Start: 2022-12-12 — End: 2022-12-12
  Administered 2022-12-12: 1 mL

## 2022-12-12 MED ORDER — TRIAMCINOLONE ACETONIDE 40 MG/ML IJ SUSP
40.0000 mg | INTRAMUSCULAR | Status: AC | PRN
Start: 2022-12-12 — End: 2022-12-12
  Administered 2022-12-12: 40 mg via INTRA_ARTICULAR

## 2022-12-12 NOTE — Patient Instructions (Signed)
Cervical Strain and Sprain Rehab Ask your health care provider which exercises are safe for you. Do exercises exactly as told by your health care provider and adjust them as directed. It is normal to feel mild stretching, pulling, tightness, or discomfort as you do these exercises. Stop right away if you feel sudden pain or your pain gets worse. Do not begin these exercises until told by your health care provider. Stretching and range-of-motion exercises Cervical side bending  Using good posture, sit on a stable chair or stand up. Without moving your shoulders, slowly tilt your left / right ear to your shoulder until you feel a stretch in the neck muscles on the opposite side. You should be looking straight ahead. Hold for __________ seconds. Repeat with the other side of your neck. Repeat __________ times. Complete this exercise __________ times a day. Cervical rotation  Using good posture, sit on a stable chair or stand up. Slowly turn your head to the side as if you are looking over your left / right shoulder. Keep your eyes level with the ground. Stop when you feel a stretch along the side and the back of your neck. Hold for __________ seconds. Repeat this by turning to your other side. Repeat __________ times. Complete this exercise __________ times a day. Thoracic extension and pectoral stretch  Roll a towel or a small blanket so it is about 4 inches (10 cm) in diameter. Lie down on your back on a firm surface. Put the towel in the middle of your back across your spine. It should not be under your shoulder blades. Put your hands behind your head and let your elbows fall out to your sides. Hold for __________ seconds. Repeat __________ times. Complete this exercise __________ times a day. Strengthening exercises Upper cervical flexion  Lie on your back with a thin pillow behind your head or a small, rolled-up towel under your neck. Gently tuck your chin toward your chest and nod  your head down to look toward your feet. Do not lift your head off the pillow. Hold for __________ seconds. Release the tension slowly. Relax your neck muscles completely before you repeat this exercise. Repeat __________ times. Complete this exercise __________ times a day. Cervical extension  Stand about 6 inches (15 cm) away from a wall, with your back facing the wall. Place a soft object, about 6-8 inches (15-20 cm) in diameter, between the back of your head and the wall. A soft object could be a small pillow, a ball, or a folded towel. Gently tilt your head back and press into the soft object. Keep your jaw and forehead relaxed. Hold for __________ seconds. Release the tension slowly. Relax your neck muscles completely before you repeat this exercise. Repeat __________ times. Complete this exercise __________ times a day. Posture and body mechanics Body mechanics refer to the movements and positions of your body while you do your daily activities. Posture is part of body mechanics. Good posture and healthy body mechanics can help to relieve stress in your body's tissues and joints. Good posture means that your spine is in its natural S-curve position (your spine is neutral), your shoulders are pulled back slightly, and your head is not tipped forward. The following are general guidelines for using improved posture and body mechanics in your everyday activities. Sitting  When sitting, keep your spine neutral and keep your feet flat on the floor. Use a footrest, if needed, and keep your thighs parallel to the floor. Avoid rounding  your shoulders. Avoid tilting your head forward. When working at a desk or a computer, keep your desk at a height where your hands are slightly lower than your elbows. Slide your chair under your desk so you are close enough to maintain good posture. When working at a computer, place your monitor at a height where you are looking straight ahead and you do not have to  tilt your head forward or downward to look at the screen. Standing  When standing, keep your spine neutral and keep your feet about hip-width apart. Keep a slight bend in your knees. Your ears, shoulders, and hips should line up. When you do a task in which you stand in one place for a long time, place one foot up on a stable object that is 2-4 inches (5-10 cm) high, such as a footstool. This helps keep your spine neutral. Resting When lying down and resting, avoid positions that are most painful for you. Try to support your neck in a neutral position. You can use a contour pillow or a small rolled-up towel. Your pillow should support your neck but not push on it. This information is not intended to replace advice given to you by your health care provider. Make sure you discuss any questions you have with your health care provider. Document Revised: 07/22/2022 Document Reviewed: 10/08/2021 Elsevier Patient Education  2024 [employer] Shoulder Exercises Ask your health care provider which exercises are safe for you. Do exercises exactly as told by your health care provider and adjust them as directed. It is normal to feel mild stretching, pulling, tightness, or discomfort as you do these exercises. Stop right away if you feel sudden pain or your pain gets worse. Do not begin these exercises until told by your health care provider. Stretching exercises External rotation and abduction This exercise is sometimes called corner stretch. The exercise rotates your arm outward (external rotation) and moves your arm out from your body (abduction). Stand in a doorway with one of your feet slightly in front of the other. This is called a staggered stance. If you cannot reach your forearms to the door frame, stand facing a corner of a room. Choose one of the following positions as told by your health care provider: Place your hands and forearms on the door frame above your head. Place your hands and forearms  on the door frame at the height of your head. Place your hands on the door frame at the height of your elbows. Slowly move your weight onto your front foot until you feel a stretch across your chest and in the front of your shoulders. Keep your head and chest upright and keep your abdominal muscles tight. Hold for __________ seconds. To release the stretch, shift your weight to your back foot. Repeat __________ times. Complete this exercise __________ times a day. Extension, standing  Stand and hold a broomstick, a cane, or a similar object behind your back. Your hands should be a little wider than shoulder-width apart. Your palms should face away from your back. Keeping your elbows straight and your shoulder muscles relaxed, move the stick away from your body until you feel a stretch in your shoulders (extension). Avoid shrugging your shoulders while you move the stick. Keep your shoulder blades tucked down toward the middle of your back. Hold for __________ seconds. Slowly return to the starting position. Repeat __________ times. Complete this exercise __________ times a day. Range-of-motion exercises Pendulum  Stand near a wall or a  surface that you can hold onto for balance. Bend at the waist and let your left / right arm hang straight down. Use your other arm to support you. Keep your back straight and do not lock your knees. Relax your left / right arm and shoulder muscles, and move your hips and your trunk so your left / right arm swings freely. Your arm should swing because of the motion of your body, not because you are using your arm or shoulder muscles. Keep moving your hips and trunk so your arm swings in the following directions, as told by your health care provider: Side to side. Forward and backward. In clockwise and counterclockwise circles. Continue each motion for __________ seconds, or for as long as told by your health care provider. Slowly return to the starting  position. Repeat __________ times. Complete this exercise __________ times a day. Shoulder flexion, standing  Stand and hold a broomstick, a cane, or a similar object. Place your hands a little more than shoulder-width apart on the object. Your left / right hand should be palm-up, and your other hand should be palm-down. Keep your elbow straight and your shoulder muscles relaxed. Push the stick up with your healthy arm to raise your left / right arm in front of your body, and then over your head until you feel a stretch in your shoulder (flexion). Avoid shrugging your shoulder while you raise your arm. Keep your shoulder blade tucked down toward the middle of your back. Hold for __________ seconds. Slowly return to the starting position. Repeat __________ times. Complete this exercise __________ times a day. Shoulder abduction, standing  Stand and hold a broomstick, a cane, or a similar object. Place your hands a little more than shoulder-width apart on the object. Your left / right hand should be palm-up, and your other hand should be palm-down. Keep your elbow straight and your shoulder muscles relaxed. Push the object across your body toward your left / right side. Raise your left / right arm to the side of your body (abduction) until you feel a stretch in your shoulder. Do not raise your arm above shoulder height unless your health care provider tells you to do that. If directed, raise your arm over your head. Avoid shrugging your shoulder while you raise your arm. Keep your shoulder blade tucked down toward the middle of your back. Hold for __________ seconds. Slowly return to the starting position. Repeat __________ times. Complete this exercise __________ times a day. Internal rotation  Place your left / right hand behind your back, palm-up. Use your other hand to dangle an exercise band, a broomstick, or a similar object over your shoulder. Grasp the band with your left / right hand so  you are holding on to both ends. Gently pull up on the band until you feel a stretch in the front of your left / right shoulder. The movement of your arm toward the center of your body is called internal rotation. Avoid shrugging your shoulder while you raise your arm. Keep your shoulder blade tucked down toward the middle of your back. Hold for __________ seconds. Release the stretch by letting go of the band and lowering your hands. Repeat __________ times. Complete this exercise __________ times a day. Strengthening exercises External rotation  Sit in a stable chair without armrests. Secure an exercise band to a stable object at elbow height on your left / right side. Place a soft object, such as a folded towel or a small  pillow, between your left / right upper arm and your body to move your elbow about 4 inches (10 cm) away from your side. Hold the end of the exercise band so it is tight and there is no slack. Keeping your elbow pressed against the soft object, slowly move your forearm out, away from your abdomen (external rotation). Keep your body steady so only your forearm moves. Hold for __________ seconds. Slowly return to the starting position. Repeat __________ times. Complete this exercise __________ times a day. Shoulder abduction  Sit in a stable chair without armrests, or stand up. Hold a __________ lb / kg weight in your left / right hand, or hold an exercise band with both hands. Start with your arms straight down and your left / right palm facing in, toward your body. Slowly lift your left / right hand out to your side (abduction). Do not lift your hand above shoulder height unless your health care provider tells you that this is safe. Keep your arms straight. Avoid shrugging your shoulder while you do this movement. Keep your shoulder blade tucked down toward the middle of your back. Hold for __________ seconds. Slowly lower your arm, and return to the starting  position. Repeat __________ times. Complete this exercise __________ times a day. Shoulder extension  Sit in a stable chair without armrests, or stand up. Secure an exercise band to a stable object in front of you so it is at shoulder height. Hold one end of the exercise band in each hand. Straighten your elbows and lift your hands up to shoulder height. Squeeze your shoulder blades together as you pull your hands down to the sides of your thighs (extension). Stop when your hands are straight down by your sides. Do not let your hands go behind your body. Hold for __________ seconds. Slowly return to the starting position. Repeat __________ times. Complete this exercise __________ times a day. Shoulder row  Sit in a stable chair without armrests, or stand up. Secure an exercise band to a stable object in front of you so it is at chest height. Hold one end of the exercise band in each hand. Position your palms so that your thumbs are facing the ceiling (neutral position). Bend each of your elbows to a 90-degree angle (right angle) and keep your upper arms at your sides. Step back or move the chair back until the band is tight and there is no slack. Slowly pull your elbows back behind you. Hold for __________ seconds. Slowly return to the starting position. Repeat __________ times. Complete this exercise __________ times a day. Shoulder press-ups  Sit in a stable chair that has armrests. Sit upright, with your feet flat on the floor. Put your hands on the armrests so your elbows are bent and your fingers are pointing forward. Your hands should be about even with the sides of your body. Push down on the armrests and use your arms to lift yourself off the chair. Straighten your elbows and lift yourself up as much as you comfortably can. Move your shoulder blades down, and avoid letting your shoulders move up toward your ears. Keep your feet on the ground. As you get stronger, your feet should  support less of your body weight as you lift yourself up. Hold for __________ seconds. Slowly lower yourself back into the chair. Repeat __________ times. Complete this exercise __________ times a day. Wall push-ups  Stand so you are facing a stable wall. Your feet should be about  one arm-length away from the wall. Lean forward and place your palms on the wall at shoulder height. Keep your feet flat on the floor as you bend your elbows and lean forward toward the wall. Hold for __________ seconds. Straighten your elbows to push yourself back to the starting position. Repeat __________ times. Complete this exercise __________ times a day. This information is not intended to replace advice given to you by your health care provider. Make sure you discuss any questions you have with your health care provider. Document Revised: 05/08/2021 Document Reviewed: 05/08/2021 Elsevier Patient Education  2024 [employer].

## 2022-12-12 NOTE — Telephone Encounter (Signed)
Pt states she saw on her chart that her colonoscopy was dated wrong on Dr. Reginia Forts notes. Her appt was on 03/29/2022 and it was put in on 03/2018.

## 2022-12-12 NOTE — Telephone Encounter (Signed)
Colonoscopy was on 03/29/2022 (exact date verified during rooming today, because patient mentioned she did not have a colonoscopy in 2019).   Please review and correct date in note from 06/05/2022 (HPI). Thanks!

## 2022-12-19 ENCOUNTER — Other Ambulatory Visit (INDEPENDENT_AMBULATORY_CARE_PROVIDER_SITE_OTHER): Payer: 59

## 2022-12-19 ENCOUNTER — Encounter: Payer: Self-pay | Admitting: Orthopedic Surgery

## 2022-12-19 ENCOUNTER — Ambulatory Visit: Payer: 59 | Admitting: Orthopedic Surgery

## 2022-12-19 VITALS — BP 124/72 | HR 77 | Ht 65.0 in | Wt 119.0 lb

## 2022-12-19 DIAGNOSIS — M542 Cervicalgia: Secondary | ICD-10-CM

## 2022-12-19 NOTE — Progress Notes (Signed)
Orthopedic Spine Surgery Office Note  Assessment: Patient is a 52 y.o. female with right shoulder pain that radiated into the arm.  Has numbness and paresthesias along the ulnar aspect of the forearm and ulnar 2 digits.  Differential includes cubital tunnel syndrome versus cervical radiculopathy.  Given the similar onset of the shoulder pain and numbness, suspect more radiculopathy   Plan: -Explained that initially conservative treatment is tried as a significant number of patients may experience relief with these treatment modalities. Discussed that the conservative treatments include:  -activity modification  -physical therapy  -over the counter pain medications  -medrol dosepak  -cervical steroid injections -Patient has tried PT, NSAIDs, IM steroid injection -Recommended use Tylenol up to 1000 mg 3 times a day for pain.  Can use NSAIDs sparingly in addition for worse days -She had noticed improvement with traction in the past so talked about inversion table -Told her if pain persist, would order an MRI of the cervical spine to evaluate further -Patient will call the office to set up an appointment for roughly 6 weeks from now if pain is still present   Patient expressed understanding of the plan and all questions were answered to the patient's satisfaction.   ___________________________________________________________________________   History:  Patient is a 52 y.o. female who presents today for cervical spine.  Patient has had 4 to 5 months of right shoulder pain that radiates into the right arm.  She also has right ulnar-sided forearm numbness and paresthesias that goes into the ulnar 2 digits.  There is no trauma or injury that preceded the onset of pain.  She says she has a history of a cervical disc protrusion that was diagnosed 11 years ago.  She got a shot with Dr. Corliss Skains recently that she found helpful and has decreased the pain.  She has been left with numbness and  paresthesias though in that right forearm and hand.  She has noticed decreased strength in her right hand and feels that affects her ability to grip things.  She has had 2 decrease some of the things that she likes to do like cooking.  She has not noticed any symptoms in her left upper extremity.   Weakness: Yes, feels right hand is weaker especially with gripping things.  No other weakness noted Difficulty with fine motor skills (e.g., buttoning shirts, handwriting): Has had clumsiness with her right hand but has not noticed any symptoms in the left side Symptoms of imbalance: Yes, sometimes feels dizzy and attributes it to menopause.  No symptoms of unsteadiness on her feet Paresthesias and numbness: Yes, has decreased sensation and paresthesias along the ulnar aspect of the forearm and in the ulnar 2 digits on the right side.  No other numbness or paresthesias Bowel or bladder incontinence: Denies Saddle anesthesia: Denies  Treatments tried: PT, NSAIDs, IM steroid injection  Review of systems: Denies fevers and chills, night sweats, unexplained weight loss, history of cancer.  Has had pain that wakes her at night  Past medical history: Sicca syndrome  Allergies: Penicillin, clindamycin, PABA and derivatives  Past surgical history:  Bilateral bunionectomy  Social history: Denies use of nicotine product (smoking, vaping, patches, smokeless) Alcohol use: Yes, approximately 4 drinks per week Denies recreational drug use   Physical Exam:  BMI of 19.8  General: no acute distress, appears stated age Neurologic: alert, answering questions appropriately, following commands Respiratory: unlabored breathing on room air, symmetric chest rise Psychiatric: appropriate affect, normal cadence to speech   MSK (spine):  -  Strength exam      Left  Right Grip strength                5/5  5/5 Interosseus   5/5   5/5 Wrist extension  5/5  5/5 Wrist flexion   5/5  5/5 Elbow flexion    5/5  5/5 Deltoid    5/5  5/5  -Sensory exam    Sensation intact to light touch in C5-T1 nerve distributions of bilateral upper extremities (decreased in C8 distribution on the right)  -Brachioradialis DTR: 2/4 on the left, 2/4 on the right -Biceps DTR: 2/4 on the left, 2/4 on the right  -Spurling: Negative bilaterally -Hoffman sign: Negative bilaterally -Clonus: No beats bilaterally -Interosseous wasting: None seen -Grip and release test: Negative  -Gait: Normal  Left shoulder exam: No pain through range of motion Right shoulder exam: No pain through range of motion  Tinel's at wrist: Negative bilaterally Durkan's: Negative bilaterally  Tinel's at elbow: Positive on the right, negative on the left  Imaging: XRs of the cervical spine from 12/19/2022 was independently reviewed and interpreted, showing lordotic alignment.  No significant degenerative changes seen.  No evidence of instability on flexion/extension views.  No fracture or dislocation seen.   Patient name: Hailey Duncan Patient MRN: [id number] Date of visit: 12/19/22

## 2023-01-14 ENCOUNTER — Other Ambulatory Visit: Payer: Self-pay | Admitting: Physician Assistant

## 2023-01-14 NOTE — Telephone Encounter (Signed)
Last Fill: 07/08/2022  Next Visit: 06/11/2023  Last Visit: 12/12/2022  Dx: Sicca syndrome   Current Dose per office note on 12/12/2022: pilocarpine 5 mg, half tablet p.o. 2 times daily.   Okay to refill Pilocarpine?

## 2023-05-28 NOTE — Progress Notes (Unsigned)
 Office Visit Note  Patient: Hailey Duncan             Date of Birth: 01/11/1971           MRN: [id number]             PCP: Irven Coe, MD Referring: Irven Coe, MD Visit Date: 06/11/2023 Occupation: @GUAROCC @  Subjective:  Sicca symptoms   History of Present Illness: Hailey Duncan is a 53 y.o. female with history of osteoarthritis, sicca symptoms, and positive ANA.  Patient was last seen in the office on 12/12/2022.  At her last office visit she had a right subacromial bursa cortisone injection performed which helped alleviate her symptoms.  She has been performing upper extremity muscle strengthening which has been helpful at managing her discomfort.  If she performs repetitive activities including cleaning the shower or caring for her animals she will have an exacerbation of symptoms.  She has occasional discomfort in the right hip consistent with trochanteric bursitis.  She tries to lay on her back at night instead of side sleeping.  Patient states that she has some difficulty with grip due to thickening of the flexor tendon of her right fifth digit.  Patient states she is unsure why it stays prominent and swollen.  Patient states that she thinks she has been evaluated by hand surgeon in the past.  She denies any locking or triggering of the right fifth digit.  She takes advil PRN for pain relief.   Patient continues to have chronic sicca symptoms.  Patient is taking pilocarpine 5 mg 1/2 tablet BID for symptomatic relief.    Activities of Daily Living:  Patient reports morning stiffness for 15-20 minutes.   Patient Reports nocturnal pain.  Difficulty dressing/grooming: Denies Difficulty climbing stairs: Denies Difficulty getting out of chair: Denies Difficulty using hands for taps, buttons, cutlery, and/or writing: Reports  Review of Systems  Constitutional:  Negative for fatigue.  HENT:  Negative for mouth sores, mouth dryness and nose dryness.   Eyes:  Negative for pain  and dryness.  Respiratory:  Negative for shortness of breath and difficulty breathing.   Cardiovascular:  Positive for palpitations. Negative for chest pain.  Gastrointestinal:  Negative for blood in stool, constipation and diarrhea.  Endocrine: Negative for increased urination.  Genitourinary:  Negative for involuntary urination.  Musculoskeletal:  Positive for joint pain, joint pain, joint swelling and morning stiffness. Negative for gait problem, myalgias, muscle weakness, muscle tenderness and myalgias.  Skin:  Negative for color change, rash, hair loss and sensitivity to sunlight.  Allergic/Immunologic: Negative for susceptible to infections.  Neurological:  Positive for numbness. Negative for dizziness and headaches.  Hematological:  Negative for swollen glands.  Psychiatric/Behavioral:  Positive for sleep disturbance. Negative for depressed mood. The patient is nervous/anxious.     PMFS History:  Patient Active Problem List   Diagnosis Date Noted  . Keratoconjunctivitis sicca 08/31/2020  . Lateral epicondylitis 08/31/2020  . Osteoarthritis 08/31/2020  . Osteopenia 08/31/2020  . Positive antinuclear antibody 08/31/2020  . Sicca syndrome (HCC) 03/14/2016  . Primary osteoarthritis of both feet 03/14/2016  . Primary osteoarthritis of both hands 03/14/2016  . 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 Cervical arthritis   . Osteoarthritis   . Sicca syndrome, unspecified     Family History  Problem Relation Age of Onset  . Macular degeneration Mother   . Seizures Mother  under control   . Hypertension Mother   . Hypertension Father   . Hypercholesterolemia Father   . Tremor Father   . Hypercholesterolemia Sister   . Colon cancer Maternal Aunt   . Colon cancer Maternal Uncle   . Colon polyps Neg Hx   . Esophageal cancer Neg Hx   . Rectal cancer Neg Hx   . Stomach cancer Neg Hx    Past Surgical History:  Procedure  Laterality Date  . BUNIONECTOMY Bilateral 2000  . COLONOSCOPY  03/29/2022   polyp- precancerous   Social History   Social History Narrative  . Not on file   Immunization History  Administered Date(s) Administered  . MMR 09/14/2010  . PFIZER(Purple Top)SARS-COV-2 Vaccination 06/29/2019, 07/21/2019     Objective: Vital Signs: BP 125/77 (BP Location: Left Arm, Patient Position: Sitting, Cuff Size: Normal)   Pulse 96   Resp 13   Ht 5\' 5"  (1.651 m)   Wt 111 lb 12.8 oz (50.7 kg)   LMP  (LMP Unknown)   BMI 18.60 kg/m    Physical Exam Vitals and nursing note reviewed.  Constitutional:      Appearance: She is well-developed.  HENT:     Head: Normocephalic and atraumatic.  Eyes:     Conjunctiva/sclera: Conjunctivae normal.  Cardiovascular:     Rate and Rhythm: Normal rate and regular rhythm.     Heart sounds: Normal heart sounds.  Pulmonary:     Effort: Pulmonary effort is normal.     Breath sounds: Normal breath sounds.  Abdominal:     General: Bowel sounds are normal.     Palpations: Abdomen is soft.  Musculoskeletal:     Cervical back: Normal range of motion.  Lymphadenopathy:     Cervical: No cervical adenopathy.  Skin:    General: Skin is warm and dry.     Capillary Refill: Capillary refill takes less than 2 seconds.  Neurological:     Mental Status: She is alert and oriented to person, place, and time.  Psychiatric:        Behavior: Behavior normal.     Musculoskeletal Exam: C-spine, thoracic spine, lumbar spine and good range of motion.  Shoulder joints have good ROM with mild discomfort bilaterally.  Tenderness of the left AC joint. elbow joints, wrist joints, MCPs, PIPs, DIPs have good range of motion with no synovitis.  Complete fist formation bilaterally.  Hip joints have good range of motion with no groin pain.  Mild tenderness over the right trochanteric bursa.  Knee joints have good range of motion no warmth or effusion.  Ankle joints have good range of  motion with no tenderness or joint swelling.  CDAI Exam: CDAI Score: -- Patient Global: --; Provider Global: -- Swollen: --; Tender: -- Joint Exam 06/11/2023   No joint exam has been documented for this visit   There is currently no information documented on the homunculus. Go to the Rheumatology activity and complete the homunculus joint exam.  Investigation: No additional findings.  Imaging: No results found.  Recent Labs: Lab Results  Component Value Date   WBC 4.4 06/10/2019   HGB 13.5 06/10/2019   PLT 173 06/10/2019   NA 136 03/18/2018   K 4.0 03/18/2018   CL 99 03/18/2018   CO2 28 03/18/2018   GLUCOSE 74 03/18/2018   BUN 13 03/18/2018   CREATININE 0.73 03/18/2018   BILITOT 0.7 03/18/2018   AST 19 03/18/2018   ALT 17 03/18/2018   PROT 7.8 03/18/2018  CALCIUM 10.1 03/18/2018   GFRAA 114 03/18/2018    Speciality Comments: No specialty comments available.  Procedures:  No procedures performed Allergies: Clindamycin/lincomycin, Paba derivatives, and Penicillins   Assessment / Plan:     Visit Diagnoses: Sicca syndrome (HCC) - ANA 1: 40, SSB negative, SSA negative, sicca symptoms: She continues to have chronic sicca symptoms and takes pilocarpine 5 mg half tablet by mouth twice daily for symptomatic relief.  She does not need a refill of pilocarpine at this time.  She was vies notify us if she develops any new or worsening symptoms.  The following lab work will be obtained today for further evaluation.  She will follow-up in the office in 6 months or sooner if needed.  - Plan: Protein / creatinine ratio, urine, CBC with Differential/Platelet, COMPLETE METABOLIC PANEL WITH GFR, ANA, C3 and C4, Anti-DNA antibody, double-stranded, Sedimentation rate, Sjogrens syndrome-A extractable nuclear antibody, Sjogrens syndrome-B extractable nuclear antibody, Serum protein electrophoresis with reflex, Rheumatoid factor  ANA positive -ANA 1: 40 NS on 06/10/2019.  She has no new  clinical features of systemic lupus at this time.  The following lab work will be obtained today for further evaluation. - Plan: Protein / creatinine ratio, urine, CBC with Differential/Platelet, COMPLETE METABOLIC PANEL WITH GFR, ANA, C3 and C4, Anti-DNA antibody, double-stranded, Sedimentation rate, Sjogrens syndrome-A extractable nuclear antibody, Sjogrens syndrome-B extractable nuclear antibody, Serum protein electrophoresis with reflex, Rheumatoid factor  Chronic right shoulder pain -She underwent a subacromial cortisone injection on 12/12/22 was provided significant relief.  She has been performing range of motion shoulder exercises and upper extremity strengthening which has been helpful.  She experiences exacerbation of discomfort in her shoulders with repetitive activities.  She takes Advil sparingly for pain relief.  Primary osteoarthritis of both hands: No synovitis was noted on examination today.  She was able to make a complete fist bilaterally.  She has some thickening of the flexor tendon of her right fifth digit.  Trochanteric bursitis, right hip: Intermittent discomfort particularly if laying on her right side.  She has been performing stretching exercises on a regular basis.  Primary osteoarthritis of both feet: She is not experiencing any increased discomfort in her feet at this time.  History of bunionectomy  DDD (degenerative disc disease), cervical: C-spine has good ROM with no discomfort at this time.   Spondylosis of lumbar region without myelopathy or radiculopathy: No symptoms of radiculopathy this time.   Numbness and tingling in right hand: Not currently symptomatic.   Orders: Orders Placed This Encounter  Procedures  . Protein / creatinine ratio, urine  . CBC with Differential/Platelet  . COMPLETE METABOLIC PANEL WITH GFR  . ANA  . C3 and C4  . Anti-DNA antibody, double-stranded  . Sedimentation rate  . Sjogrens syndrome-A extractable nuclear antibody  .  Sjogrens syndrome-B extractable nuclear antibody  . Serum protein electrophoresis with reflex  . Rheumatoid factor   No orders of the defined types were placed in this encounter.     Follow-Up Instructions: Return in about 6 months (around 12/12/2023) for Osteoarthritis, +ANA.   Gearldine Bienenstock, PA-C  Note - This record has been created using Dragon software.  Chart creation errors have been sought, but may not always  have been located. Such creation errors do not reflect on  the standard of medical care.

## 2023-06-11 ENCOUNTER — Ambulatory Visit: Payer: 59 | Attending: Physician Assistant | Admitting: Physician Assistant

## 2023-06-11 ENCOUNTER — Encounter: Payer: Self-pay | Admitting: Physician Assistant

## 2023-06-11 VITALS — BP 125/77 | HR 96 | Resp 13 | Ht 65.0 in | Wt 111.8 lb

## 2023-06-11 DIAGNOSIS — M19041 Primary osteoarthritis, right hand: Secondary | ICD-10-CM

## 2023-06-11 DIAGNOSIS — G8929 Other chronic pain: Secondary | ICD-10-CM

## 2023-06-11 DIAGNOSIS — M19072 Primary osteoarthritis, left ankle and foot: Secondary | ICD-10-CM

## 2023-06-11 DIAGNOSIS — M3500 Sicca syndrome, unspecified: Secondary | ICD-10-CM

## 2023-06-11 DIAGNOSIS — M19042 Primary osteoarthritis, left hand: Secondary | ICD-10-CM

## 2023-06-11 DIAGNOSIS — M19071 Primary osteoarthritis, right ankle and foot: Secondary | ICD-10-CM

## 2023-06-11 DIAGNOSIS — R202 Paresthesia of skin: Secondary | ICD-10-CM

## 2023-06-11 DIAGNOSIS — R768 Other specified abnormal immunological findings in serum: Secondary | ICD-10-CM

## 2023-06-11 DIAGNOSIS — M25511 Pain in right shoulder: Secondary | ICD-10-CM

## 2023-06-11 DIAGNOSIS — M47816 Spondylosis without myelopathy or radiculopathy, lumbar region: Secondary | ICD-10-CM

## 2023-06-11 DIAGNOSIS — Z9889 Other specified postprocedural states: Secondary | ICD-10-CM

## 2023-06-11 DIAGNOSIS — M5030 Other cervical disc degeneration, unspecified cervical region: Secondary | ICD-10-CM

## 2023-06-11 DIAGNOSIS — R200 Anesthesia of skin: Secondary | ICD-10-CM

## 2023-06-11 DIAGNOSIS — M7061 Trochanteric bursitis, right hip: Secondary | ICD-10-CM

## 2023-06-11 NOTE — Progress Notes (Signed)
 CBC WNL

## 2023-06-11 NOTE — Progress Notes (Signed)
 ESR WNL

## 2023-06-12 NOTE — Progress Notes (Signed)
Alk phos is borderline low. Rest of CMP WNL.

## 2023-06-12 NOTE — Progress Notes (Signed)
 RF negative.  C4 is borderline low.  C3 WNL.

## 2023-06-12 NOTE — Progress Notes (Signed)
No proteinuria

## 2023-06-13 LAB — PROTEIN ELECTROPHORESIS, SERUM, WITH REFLEX
Albumin ELP: 4.6 g/dL (ref 3.8–4.8)
Alpha 1: 0.3 g/dL (ref 0.2–0.3)
Alpha 2: 0.7 g/dL (ref 0.5–0.9)
Beta 2: 0.3 g/dL (ref 0.2–0.5)
Beta Globulin: 0.5 g/dL (ref 0.4–0.6)
Gamma Globulin: 0.8 g/dL (ref 0.8–1.7)
Total Protein: 7.1 g/dL (ref 6.1–8.1)

## 2023-06-13 LAB — COMPLETE METABOLIC PANEL WITH GFR
AG Ratio: 1.9 (calc) (ref 1.0–2.5)
ALT: 24 U/L (ref 6–29)
AST: 19 U/L (ref 10–35)
Albumin: 4.5 g/dL (ref 3.6–5.1)
Alkaline phosphatase (APISO): 29 U/L — ABNORMAL LOW (ref 37–153)
BUN: 16 mg/dL (ref 7–25)
CO2: 29 mmol/L (ref 20–32)
Calcium: 9.7 mg/dL (ref 8.6–10.4)
Chloride: 102 mmol/L (ref 98–110)
Creat: 0.73 mg/dL (ref 0.50–1.03)
Globulin: 2.4 g/dL (ref 1.9–3.7)
Glucose, Bld: 82 mg/dL (ref 65–99)
Potassium: 4.3 mmol/L (ref 3.5–5.3)
Sodium: 139 mmol/L (ref 135–146)
Total Bilirubin: 0.5 mg/dL (ref 0.2–1.2)
Total Protein: 6.9 g/dL (ref 6.1–8.1)
eGFR: 100 mL/min/{1.73_m2} (ref >=60)

## 2023-06-13 LAB — CBC WITH DIFFERENTIAL/PLATELET
Absolute Lymphocytes: 979 {cells}/uL (ref 850–3900)
Absolute Monocytes: 307 {cells}/uL (ref 200–950)
Basophils Absolute: 29 {cells}/uL (ref 0–200)
Basophils Relative: 0.7 %
Eosinophils Absolute: 21 {cells}/uL (ref 15–500)
Eosinophils Relative: 0.5 %
HCT: 40.6 % (ref 35.0–45.0)
Hemoglobin: 13.7 g/dL (ref 11.7–15.5)
MCH: 31.1 pg (ref 27.0–33.0)
MCHC: 33.7 g/dL (ref 32.0–36.0)
MCV: 92.1 fL (ref 80.0–100.0)
MPV: 10.9 fL (ref 7.5–12.5)
Monocytes Relative: 7.3 %
Neutro Abs: 2864 {cells}/uL (ref 1500–7800)
Neutrophils Relative %: 68.2 %
Platelets: 201 10*3/uL (ref 140–400)
RBC: 4.41 10*6/uL (ref 3.80–5.10)
RDW: 11.8 % (ref 11.0–15.0)
Total Lymphocyte: 23.3 %
WBC: 4.2 10*3/uL (ref 3.8–10.8)

## 2023-06-13 LAB — PROTEIN / CREATININE RATIO, URINE
Creatinine, Urine: 22 mg/dL (ref 20–275)
Total Protein, Urine: <4 mg/dL — ABNORMAL LOW (ref 5–24)

## 2023-06-13 LAB — SJOGRENS SYNDROME-A EXTRACTABLE NUCLEAR ANTIBODY: SSA (Ro) (ENA) Antibody, IgG: <1 AI

## 2023-06-13 LAB — SEDIMENTATION RATE: Sed Rate: 6 mm/h (ref 0–30)

## 2023-06-13 LAB — SJOGRENS SYNDROME-B EXTRACTABLE NUCLEAR ANTIBODY: SSB (La) (ENA) Antibody, IgG: <1 AI

## 2023-06-13 LAB — ANA: Anti Nuclear Antibody (ANA): NEGATIVE

## 2023-06-13 LAB — ANTI-DNA ANTIBODY, DOUBLE-STRANDED: ds DNA Ab: 1 [IU]/mL

## 2023-06-13 LAB — RHEUMATOID FACTOR: Rheumatoid fact SerPl-aCnc: 10 [IU]/mL (ref <14)

## 2023-06-13 LAB — C3 AND C4
C3 Complement: 123 mg/dL (ref 83–193)
C4 Complement: 14 mg/dL — ABNORMAL LOW (ref 15–57)

## 2023-06-13 NOTE — Progress Notes (Signed)
 ANA negative

## 2023-06-15 NOTE — Progress Notes (Signed)
 SPEP  normal.

## 2023-09-11 ENCOUNTER — Ambulatory Visit: Admitting: Professional Counselor

## 2023-12-05 NOTE — Progress Notes (Signed)
 Office Visit Note  Patient: Hailey Duncan             Date of Birth: 12-20-1970           MRN: [id number]             PCP: Leonel Cole, MD Referring: Leonel Cole, MD Visit Date: 12/17/2023 Occupation: @GUAROCC @  Subjective:  Medication management  History of Present Illness: [name] is a 53 y.o. female with sicca symptoms, positive ANA and osteoarthritis.  She returns today after her last visit in March 2025.  She continues to have dry mouth and dry eye symptoms which are manageable with pilocarpine  5 mg half tablet twice a day as needed.  He has been experiencing increased pain and discomfort in the bilateral hands which she relates to taking care of her new puppy.  She states she has been having some discomfort over her left clavicle for the last 4 to 5 months.  She states been more bothersome recently and has been causing some nocturnal discomfort.  The right shoulder pain has completely resolved.  Right trochanteric bursitis also improved.  She gets occasional tendinitis in her right ankle.  She has not had much discomfort in the neck and lower back.  Denies any shortness of breath or lymphadenopathy.    Activities of Daily Living:  Patient reports morning stiffness for all day. Patient Reports nocturnal pain.  Difficulty dressing/grooming: Reports Difficulty climbing stairs: Denies Difficulty getting out of chair: Denies Difficulty using hands for taps, buttons, cutlery, and/or writing: Reports  Review of Systems  Constitutional:  Positive for fatigue.  HENT:  Negative for mouth sores and mouth dryness.   Eyes:  Negative for dryness.  Respiratory:  Negative for shortness of breath.   Cardiovascular:  Negative for chest pain and palpitations.  Gastrointestinal:  Negative for blood in stool, constipation and diarrhea.  Endocrine: Negative for increased urination.  Genitourinary:  Negative for involuntary urination.  Musculoskeletal:  Positive for joint pain, joint  pain, joint swelling, myalgias, morning stiffness and myalgias. Negative for gait problem, muscle weakness and muscle tenderness.  Skin:  Positive for rash. Negative for color change, hair loss and sensitivity to sunlight.  Allergic/Immunologic: Negative for susceptible to infections.  Neurological:  Positive for dizziness, numbness, headaches and parasthesias.  Hematological:  Negative for swollen glands.  Psychiatric/Behavioral:  Positive for sleep disturbance. Negative for depressed mood. The patient is not nervous/anxious.     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   Lateral epicondylitis 08/31/2020   Osteoarthritis 08/31/2020   Osteopenia 08/31/2020   Positive antinuclear antibody 08/31/2020   Sicca syndrome (HCC) 03/14/2016   Primary osteoarthritis of both feet 03/14/2016   Primary osteoarthritis of both hands 03/14/2016   Spondylosis without myelopathy 03/14/2016    Past Medical History:  Diagnosis Date   Cervical arthritis    Osteoarthritis    Sicca syndrome, unspecified     Family History  Problem Relation Age of Onset   Macular degeneration Mother    Seizures Mother        under control    Hypertension Mother    Hypertension Father    Hypercholesterolemia Father    Tremor Father    Hypercholesterolemia Sister    Colon cancer Maternal Aunt    Colon cancer Maternal Uncle    Colon polyps Neg Hx    Esophageal cancer Neg Hx    Rectal cancer Neg Hx  Stomach cancer Neg Hx    Past Surgical History:  Procedure Laterality Date   BUNIONECTOMY Bilateral 2000   COLONOSCOPY  03/29/2022   polyp- precancerous   Social History   Social History Narrative   Not on file   Immunization History  Administered Date(s) Administered   MMR 09/14/2010   PFIZER(Purple Top)SARS-COV-2 Vaccination 06/29/2019, 07/21/2019     Objective: Vital Signs: BP 132/82   Pulse 72   Temp 99.8 F (37.7 C)   Resp 13    Ht 5' 5 (1.651 m)   Wt 112 lb 12.8 oz (51.2 kg)   LMP  (LMP Unknown)   BMI 18.77 kg/m    Physical Exam Vitals and nursing note reviewed.  Constitutional:      Appearance: She is well-developed.  HENT:     Head: Normocephalic and atraumatic.  Eyes:     Conjunctiva/sclera: Conjunctivae normal.  Cardiovascular:     Rate and Rhythm: Normal rate and regular rhythm.     Heart sounds: Normal heart sounds.  Pulmonary:     Effort: Pulmonary effort is normal.     Breath sounds: Normal breath sounds.  Abdominal:     General: Bowel sounds are normal.     Palpations: Abdomen is soft.  Musculoskeletal:     Cervical back: Normal range of motion.  Lymphadenopathy:     Cervical: No cervical adenopathy.  Skin:    General: Skin is warm and dry.     Capillary Refill: Capillary refill takes less than 2 seconds.  Neurological:     Mental Status: She is alert and oriented to person, place, and time.  Psychiatric:        Behavior: Behavior normal.      Musculoskeletal Exam: Cervical, thoracic and lumbar spine were in good range of motion.  There was no SI joint tenderness.  She had tenderness and prominence of left sternoclavicular joint.  She also had mid clavicular tenderness.  Shoulder joints, elbow joints, wrist joints, MCPs, PIPs and DIPs were in good range of motion with no synovitis.  Hip joints and knee joints were in good range of motion without any warmth swelling or effusion.  There was no tenderness over ankles or MTPs.   CDAI Exam: CDAI Score: -- Patient Global: --; Provider Global: -- Swollen: --; Tender: -- Joint Exam 12/17/2023   No joint exam has been documented for this visit   There is currently no information documented on the homunculus. Go to the Rheumatology activity and complete the homunculus joint exam.  Investigation: No additional findings.  Imaging: No results found.  Recent Labs: Lab Results  Component Value Date   WBC 4.2 06/11/2023   HGB 13.7  06/11/2023   PLT 201 06/11/2023   NA 139 06/11/2023   K 4.3 06/11/2023   CL 102 06/11/2023   CO2 29 06/11/2023   GLUCOSE 82 06/11/2023   BUN 16 06/11/2023   CREATININE 0.73 06/11/2023   BILITOT 0.5 06/11/2023   AST 19 06/11/2023   ALT 24 06/11/2023   PROT 6.9 06/11/2023   PROT 7.1 06/11/2023   CALCIUM 9.7 06/11/2023   GFRAA 114 03/18/2018    Speciality Comments: No specialty comments available.  Procedures:  No procedures performed Allergies: Clindamycin/lincomycin, Paba derivatives, and Penicillins   Assessment / Plan:     Visit Diagnoses: Pain of left sternoclavicular joint -patient has been experiencing discomfort in the left sternoclavicular joint for the last few months.  She states recently she has been having nocturnal pain.  She had tenderness on palpation over the left sternoclavicular joint.  Patient denies any injury.  She states she has been walking her new puppy which is causing increased discomfort in the region.  Plan: Sedimentation rate, Cyclic citrul peptide antibody, IgG, C-reactive protein.  I will get x-ray of the left sternoclavicular joint and clavicle.  Sicca syndrome (HCC) -she states that the sicca symptoms are manageable with pilocarpine .  She does not have to use any over-the-counter products.  Repeat ANA was negative. SSB negative, SSA negative, rheumatoid factor negative, sicca symptoms:pilocarpine  5 mg half tablet by mouth twice daily for symptomatic relief.  She denies any shortness of breath or lymphadenopathy.  Labs obtained on June 11, 2023 SPEP normal, urine protein creatinine ratio normal, C4 low at 14, ANA negative, dsDNA negative, SSA negative, SSB negative, RF negative, sed rate normal labs were reviewed with the patient.  ANA positive - ANA 1: 40 NS on 06/10/2019.  Repeat ANA negative.  She has no new clinical features of systemic lupus at this time.  Primary osteoarthritis of both hands-she has been experiencing increased pain and discomfort in  her bilateral hands.  Bilateral PIP and DIP thickening was noted.  She states the pain has increased since she has been walking her new puppy and taking care of the puppy.  Joint protection and muscle strengthening was discussed.  A handout on hand exercises was placed in the AVS.  Trochanteric bursitis, right hip-she has intermittent discomfort which is not symptomatic today.  Primary osteoarthritis of both feet-she reports occasional discomfort in her right ankle where she had tendinitis in the past.  History of bunionectomy  DDD (degenerative disc disease), cervical-she good range of motion without discomfort.  Spondylosis of lumbar region without myelopathy or radiculopathy-she denies discomfort today.    Orders: Orders Placed This Encounter  Procedures   DG Clavicle Left   Sedimentation rate   Cyclic citrul peptide antibody, IgG   C-reactive protein   No orders of the defined types were placed in this encounter.    Follow-Up Instructions: Return in about 3 months (around 03/17/2024) for Left sternoclavicular pain, sicca symptoms.   Maya Nash, MD  Note - This record has been created using Animal nutritionist.  Chart creation errors have been sought, but may not always  have been located. Such creation errors do not reflect on  the standard of medical care.

## 2023-12-17 ENCOUNTER — Ambulatory Visit: Attending: Rheumatology | Admitting: Rheumatology

## 2023-12-17 ENCOUNTER — Ambulatory Visit: Payer: Self-pay | Admitting: Rheumatology

## 2023-12-17 ENCOUNTER — Ambulatory Visit
Admission: RE | Admit: 2023-12-17 | Discharge: 2023-12-17 | Disposition: A | Discharge status: Home / Self Care | Source: Ambulatory Visit | Attending: Rheumatology | Admitting: Rheumatology

## 2023-12-17 ENCOUNTER — Encounter: Payer: Self-pay | Admitting: Rheumatology

## 2023-12-17 VITALS — BP 132/82 | HR 72 | Temp 99.8°F | Resp 13 | Ht 65.0 in | Wt 112.8 lb

## 2023-12-17 DIAGNOSIS — M19042 Primary osteoarthritis, left hand: Secondary | ICD-10-CM

## 2023-12-17 DIAGNOSIS — R200 Anesthesia of skin: Secondary | ICD-10-CM

## 2023-12-17 DIAGNOSIS — M19041 Primary osteoarthritis, right hand: Secondary | ICD-10-CM | POA: Diagnosis not present

## 2023-12-17 DIAGNOSIS — M3500 Sicca syndrome, unspecified: Secondary | ICD-10-CM | POA: Diagnosis not present

## 2023-12-17 DIAGNOSIS — M7061 Trochanteric bursitis, right hip: Secondary | ICD-10-CM

## 2023-12-17 DIAGNOSIS — M25512 Pain in left shoulder: Secondary | ICD-10-CM | POA: Diagnosis not present

## 2023-12-17 DIAGNOSIS — M47816 Spondylosis without myelopathy or radiculopathy, lumbar region: Secondary | ICD-10-CM

## 2023-12-17 DIAGNOSIS — Z9889 Other specified postprocedural states: Secondary | ICD-10-CM

## 2023-12-17 DIAGNOSIS — M5030 Other cervical disc degeneration, unspecified cervical region: Secondary | ICD-10-CM

## 2023-12-17 DIAGNOSIS — M19071 Primary osteoarthritis, right ankle and foot: Secondary | ICD-10-CM

## 2023-12-17 DIAGNOSIS — R768 Other specified abnormal immunological findings in serum: Secondary | ICD-10-CM | POA: Diagnosis not present

## 2023-12-17 DIAGNOSIS — G8929 Other chronic pain: Secondary | ICD-10-CM

## 2023-12-17 DIAGNOSIS — M19072 Primary osteoarthritis, left ankle and foot: Secondary | ICD-10-CM

## 2023-12-17 NOTE — Patient Instructions (Signed)

## 2023-12-17 NOTE — Progress Notes (Signed)
 We can refer her to orthopedics for evaluation if she has ongoing pain.

## 2023-12-17 NOTE — Progress Notes (Signed)
 Please notify patient that per radiology report -minor left AC joint degenerative change. No acute finding by plain radiography.

## 2023-12-19 LAB — CYCLIC CITRUL PEPTIDE ANTIBODY, IGG: Cyclic Citrullin Peptide Ab: <16 U

## 2023-12-19 LAB — C-REACTIVE PROTEIN: CRP: <3 mg/L (ref <8.0)

## 2023-12-19 LAB — SEDIMENTATION RATE: Sed Rate: 6 mm/h (ref 0–30)

## 2023-12-19 NOTE — Progress Notes (Signed)
 Sed rate and CRP( inflammation markers) are normal. Anti-CCP( test for rheumatoid arthritis) is negative.

## 2024-03-17 NOTE — Progress Notes (Unsigned)
 Office Visit Note  Patient: Hailey Duncan             Date of Birth: Jan 17, 1971           MRN: [id number]             PCP: Leonel Cole, MD Referring: Leonel Cole, MD Visit Date: 03/18/2024 Occupation: Data Unavailable  Subjective:  Numbness in both hands   History of Present Illness: Hailey Duncan is a 53 y.o. female with history of positive ANA and osteoarthritis.  Patient reports that she has noticed increased eye dryness especially since June and heat on due to cooler weather.  She has been using refresh eyedrops and occasionally Pataday for relief.  Her mouth dryness has been manageable.  She continues to follow closely with the dentist.  According to the patient she will be getting a bite guard due to grinding her teeth at night.  She uses a humidifier at home as needed.  She denies any recent rashes.  She denies any Raynaud's phenomenon.  Patient reports that she continues to experience intermittent pain in the left sternoclavicular joint.  Patient states that she has also had intermittent discomfort and stiffness affecting the cervical spine.  She experiences numbness and tingling affecting both hands.  Patient states that in the morning she wakes up with both upper extremities numb.  Patient states that the symptoms also occur with certain positional changes and activities. Patient states that she has been able to increase her walking regimen since she has been walking her dog about 5 miles daily.  Activities of Daily Living:  Patient reports morning stiffness for 15-20 minutes.   Patient Reports nocturnal pain.  Difficulty dressing/grooming: Denies Difficulty climbing stairs: Denies Difficulty getting out of chair: Denies Difficulty using hands for taps, buttons, cutlery, and/or writing: Reports  Review of Systems  Constitutional:  Positive for fatigue.  HENT:  Positive for mouth sores. Negative for mouth dryness.   Eyes:  Positive for dryness.  Respiratory:  Negative  for shortness of breath.   Cardiovascular:  Negative for chest pain and palpitations.  Gastrointestinal:  Negative for blood in stool, constipation and diarrhea.  Endocrine: Negative for increased urination.  Genitourinary:  Negative for involuntary urination.  Musculoskeletal:  Positive for joint pain, gait problem, joint pain, joint swelling, myalgias, morning stiffness and myalgias. Negative for muscle weakness and muscle tenderness.  Skin:  Negative for color change, rash, hair loss and sensitivity to sunlight.  Allergic/Immunologic: Negative for susceptible to infections.  Neurological:  Positive for dizziness and headaches.  Hematological:  Negative for swollen glands.  Psychiatric/Behavioral:  Positive for sleep disturbance. Negative for depressed mood. The patient is not nervous/anxious.     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   Lateral epicondylitis 08/31/2020   Osteoarthritis 08/31/2020   Osteopenia 08/31/2020   Positive antinuclear antibody 08/31/2020   Sicca syndrome 03/14/2016   Primary osteoarthritis of both feet 03/14/2016   Primary osteoarthritis of both hands 03/14/2016   Spondylosis without myelopathy 03/14/2016    Past Medical History:  Diagnosis Date   Cervical arthritis    Osteoarthritis    Sicca syndrome, unspecified     Family History  Problem Relation Age of Onset   Macular degeneration Mother    Seizures Mother        under control    Hypertension Mother    Hypertension Father    Hypercholesterolemia Father  Tremor Father    Hypercholesterolemia Sister    Colon cancer Maternal Aunt    Colon cancer Maternal Uncle    Colon polyps Neg Hx    Esophageal cancer Neg Hx    Rectal cancer Neg Hx    Stomach cancer Neg Hx    Past Surgical History:  Procedure Laterality Date   BUNIONECTOMY Bilateral 2000   COLONOSCOPY  03/29/2022   polyp- precancerous   Social History[1] Social  History   Social History Narrative   Not on file     Immunization History  Administered Date(s) Administered   MMR 09/14/2010   PFIZER(Purple Top)SARS-COV-2 Vaccination 06/29/2019, 07/21/2019     Objective: Vital Signs: BP 135/73   Pulse 84   Temp 97.8 F (36.6 C)   Resp 14   Ht 5' 5 (1.651 m)   Wt 113 lb 6.4 oz (51.4 kg)   LMP  (LMP Unknown)   BMI 18.87 kg/m    Physical Exam Vitals and nursing note reviewed.  Constitutional:      Appearance: She is well-developed.  HENT:     Head: Normocephalic and atraumatic.  Eyes:     Conjunctiva/sclera: Conjunctivae normal.  Cardiovascular:     Rate and Rhythm: Normal rate and regular rhythm.     Heart sounds: Normal heart sounds.  Pulmonary:     Effort: Pulmonary effort is normal.     Breath sounds: Normal breath sounds.  Abdominal:     General: Bowel sounds are normal.     Palpations: Abdomen is soft.  Musculoskeletal:     Cervical back: Normal range of motion.  Lymphadenopathy:     Cervical: No cervical adenopathy.  Skin:    General: Skin is warm and dry.     Capillary Refill: Capillary refill takes less than 2 seconds.  Neurological:     Mental Status: She is alert and oriented to person, place, and time.  Psychiatric:        Behavior: Behavior normal.      Musculoskeletal Exam: C-spine, thoracic spine, lumbar spine have good range of motion.  Mild midline spinal tenderness in the cervical region.  No SI joint tenderness.  Shoulder joints, elbow joints, wrist joints, MCPs, PIPs, DIPs have good range of motion with no synovitis.  Mild tenderness and prominence of the left sternoclavicular joint.  Complete fist formation bilaterally.  Hip joints have good range of motion with no groin pain.  Knee joints have good range of motion no warmth or effusion.  Ankle joints have good range of motion no tenderness or joint swelling.  No evidence of Achilles tendinitis or plantar fasciitis.   CDAI Exam: CDAI Score: -- Patient  Global: --; Provider Global: -- Swollen: --; Tender: -- Joint Exam 03/18/2024   No joint exam has been documented for this visit   There is currently no information documented on the homunculus. Go to the Rheumatology activity and complete the homunculus joint exam.  Investigation: No additional findings.  Imaging: No results found.  Recent Labs: Lab Results  Component Value Date   WBC 4.2 06/11/2023   HGB 13.7 06/11/2023   PLT 201 06/11/2023   NA 139 06/11/2023   K 4.3 06/11/2023   CL 102 06/11/2023   CO2 29 06/11/2023   GLUCOSE 82 06/11/2023   BUN 16 06/11/2023   CREATININE 0.73 06/11/2023   BILITOT 0.5 06/11/2023   AST 19 06/11/2023   ALT 24 06/11/2023   PROT 6.9 06/11/2023   PROT 7.1 06/11/2023  CALCIUM 9.7 06/11/2023   GFRAA 114 03/18/2018    Speciality Comments: No specialty comments available.  Procedures:  No procedures performed Allergies: Clindamycin/lincomycin, Paba (para-aminobenzoate) derivatives, and Penicillins   Assessment / Plan:     Visit Diagnoses: Sicca syndrome: Ro negative, La negative, ANA negative on 06/11/23: Patient continues to have chronic dry eyes which have been exacerbated with the colder weather temperatures and having heat on in the home.  She has been using refresh eyedrops and Pataday as needed.  She also uses a humidifier. She has not noticed any increased mouth dryness.  She follows up with a dentist closely. She was advised to notify us  if she develops any new or worsening symptoms.  She will follow-up in the office in 5 to 6 months or sooner if needed.  ANA positive - ANA 1: 40 NS on 06/10/2019.  Repeat ANA negative on 06/11/2023.  She has no new clinical features of systemic lupus at this time. C4 was low on 06/11/2023 but the rest of the rheumatologic workup was negative.  Discussed results with the patient today in the office.  She is not experiencing any new or worsening of symptoms.  She does not require immunosuppression at  this time.  She was advised to notify us  if she develops any new or worsening symptoms.  Plan to update lab work at the next visit.  Pain of left sternoclavicular joint: X-rays of the left clavicle were obtained on 12/17/2023: Unremarkable appearance of the sternoclavicular joints.  Minor degenerative irregularity and bony spurring of the [facility] joint noted.  Left shoulder joint unremarkable. She continues to experience intermittent soreness and swelling of the left sternoclavicular joint.  Mild tenderness and prominence noted on examination today.  Overall her symptoms have been manageable.  Primary osteoarthritis of both hands: No synovitis noted. Complete fist formation bilaterally.   Numbness and tingling of both upper extremities:She experiences intermittent numbness affecting both UE.  Worse first thing in the morning and with certain activities.  Encourage patient to follow back up with Dr. Georgina if the symptoms persist or worsen.   Trochanteric bursitis, right hip: Not currently symptomatic.   Primary osteoarthritis of both feet: She has good ROM of both ankle joints with no tenderness or joint swelling.  No tenderness of MCP joints.    History of bunionectomy: Age 63.    DDD (degenerative disc disease), cervical: C-spine has good ROM--intermittent stiffness. Mild midline spinal tenderness in the cervical region.  Previously evaluated by Dr. Georgina. X-rays of the cervical spine were obtained on 12/19/2022: No significant degenerative changes noted.  No evidence of instability on flexion-extension views.  No fracture or dislocation seen. Patient experiences numbness in both upper extremities especially first thing in the mornings.  She has undergone a nerve conduction study in the past. According to the patient she has been told that in the past she has a bulging disc.  She did not want to proceed with surgical intervention.  Patient was advised to monitor symptoms closely and to reach out to Dr.  Georgina if the symptoms persist or worsen.    Spondylosis of lumbar region without myelopathy or radiculopathy: No midline spinal tenderness in the lumbar region.  No symptoms of radiculopathy.    Chronic right shoulder pain: Not currently symptomatic.  Good ROM with no discomfort currently.  Right subacromial bursa cortisone injection on 12/12/2022.   Orders: No orders of the defined types were placed in this encounter.  No orders of the defined types  were placed in this encounter.    Follow-Up Instructions: Return in about 5 months (around 08/16/2024) for +ANA, Osteoarthritis.   Waddell CHRISTELLA Craze, PA-C  Note - This record has been created using Dragon software.  Chart creation errors have been sought, but may not always  have been located. Such creation errors do not reflect on  the standard of medical care.     [1]  Social History Tobacco Use   Smoking status: Never    Passive exposure: Past   Smokeless tobacco: Never  Vaping Use   Vaping status: Never Used  Substance Use Topics   Alcohol use: Yes    Alcohol/week: 1.0 standard drink of alcohol    Types: 1 Glasses of wine per week    Comment: occ   Drug use: Never

## 2024-03-18 ENCOUNTER — Ambulatory Visit: Admitting: Physician Assistant

## 2024-03-18 ENCOUNTER — Encounter: Payer: Self-pay | Admitting: Physician Assistant

## 2024-03-18 VITALS — BP 135/73 | HR 84 | Temp 97.8°F | Resp 14 | Ht 65.0 in | Wt 113.4 lb

## 2024-03-18 DIAGNOSIS — M5030 Other cervical disc degeneration, unspecified cervical region: Secondary | ICD-10-CM | POA: Diagnosis not present

## 2024-03-18 DIAGNOSIS — M47816 Spondylosis without myelopathy or radiculopathy, lumbar region: Secondary | ICD-10-CM

## 2024-03-18 DIAGNOSIS — M19071 Primary osteoarthritis, right ankle and foot: Secondary | ICD-10-CM

## 2024-03-18 DIAGNOSIS — Z9889 Other specified postprocedural states: Secondary | ICD-10-CM | POA: Diagnosis not present

## 2024-03-18 DIAGNOSIS — M7061 Trochanteric bursitis, right hip: Secondary | ICD-10-CM | POA: Diagnosis not present

## 2024-03-18 DIAGNOSIS — R7689 Other specified abnormal immunological findings in serum: Secondary | ICD-10-CM | POA: Diagnosis not present

## 2024-03-18 DIAGNOSIS — R200 Anesthesia of skin: Secondary | ICD-10-CM

## 2024-03-18 DIAGNOSIS — R202 Paresthesia of skin: Secondary | ICD-10-CM

## 2024-03-18 DIAGNOSIS — M25511 Pain in right shoulder: Secondary | ICD-10-CM | POA: Diagnosis not present

## 2024-03-18 DIAGNOSIS — M3500 Sicca syndrome, unspecified: Secondary | ICD-10-CM | POA: Diagnosis not present

## 2024-03-18 DIAGNOSIS — M19041 Primary osteoarthritis, right hand: Secondary | ICD-10-CM

## 2024-03-18 DIAGNOSIS — M25512 Pain in left shoulder: Secondary | ICD-10-CM | POA: Diagnosis not present

## 2024-03-18 DIAGNOSIS — G8929 Other chronic pain: Secondary | ICD-10-CM

## 2024-03-18 DIAGNOSIS — M19042 Primary osteoarthritis, left hand: Secondary | ICD-10-CM

## 2024-03-18 DIAGNOSIS — M19072 Primary osteoarthritis, left ankle and foot: Secondary | ICD-10-CM

## 2024-08-18 ENCOUNTER — Ambulatory Visit: Admitting: Physician Assistant
